# Patient Record
Sex: Female | Born: 1945 | Race: White | Hispanic: No | Smoking: Never smoker
Health system: Southern US, Community
[De-identification: ages and names within clinical notes are randomized; demographics above are authoritative.]

## PROBLEM LIST (undated history)

## (undated) DIAGNOSIS — G250 Essential tremor: Secondary | ICD-10-CM

## (undated) DIAGNOSIS — G20A1 Parkinson's disease without dyskinesia, without mention of fluctuations: Secondary | ICD-10-CM

## (undated) DIAGNOSIS — T148XXA Other injury of unspecified body region, initial encounter: Secondary | ICD-10-CM

## (undated) DIAGNOSIS — R931 Abnormal findings on diagnostic imaging of heart and coronary circulation: Secondary | ICD-10-CM

## (undated) DIAGNOSIS — M1990 Unspecified osteoarthritis, unspecified site: Secondary | ICD-10-CM

## (undated) DIAGNOSIS — E559 Vitamin D deficiency, unspecified: Secondary | ICD-10-CM

## (undated) DIAGNOSIS — R5383 Other fatigue: Secondary | ICD-10-CM

## (undated) DIAGNOSIS — C4492 Squamous cell carcinoma of skin, unspecified: Secondary | ICD-10-CM

## (undated) DIAGNOSIS — I82409 Acute embolism and thrombosis of unspecified deep veins of unspecified lower extremity: Secondary | ICD-10-CM

## (undated) DIAGNOSIS — I700 Atherosclerosis of aorta: Secondary | ICD-10-CM

## (undated) DIAGNOSIS — Z8249 Family history of ischemic heart disease and other diseases of the circulatory system: Secondary | ICD-10-CM

## (undated) DIAGNOSIS — G20 Parkinson's disease: Secondary | ICD-10-CM

## (undated) DIAGNOSIS — M79602 Pain in left arm: Secondary | ICD-10-CM

## (undated) DIAGNOSIS — M8580 Other specified disorders of bone density and structure, unspecified site: Secondary | ICD-10-CM

## (undated) HISTORY — DX: Other injury of unspecified body region, initial encounter: T14.8XXA

## (undated) HISTORY — DX: Vitamin D deficiency, unspecified: E55.9

## (undated) HISTORY — PX: MOUTH SURGERY: SHX715

## (undated) HISTORY — PX: KNEE ARTHROSCOPY WITH MENISCAL REPAIR: SHX5653

## (undated) HISTORY — DX: Abnormal findings on diagnostic imaging of heart and coronary circulation: R93.1

## (undated) HISTORY — DX: Other fatigue: R53.83

## (undated) HISTORY — PX: COLONOSCOPY: SHX174

## (undated) HISTORY — DX: Other specified disorders of bone density and structure, unspecified site: M85.80

## (undated) HISTORY — DX: Unspecified osteoarthritis, unspecified site: M19.90

## (undated) HISTORY — DX: Atherosclerosis of aorta: I70.0

## (undated) HISTORY — DX: Pain in left arm: M79.602

## (undated) HISTORY — DX: Family history of ischemic heart disease and other diseases of the circulatory system: Z82.49

## (undated) HISTORY — DX: Acute embolism and thrombosis of unspecified deep veins of unspecified lower extremity: I82.409

---

## 1998-06-02 ENCOUNTER — Other Ambulatory Visit: Admission: RE | Admit: 1998-06-02 | Discharge: 1998-06-02 | Payer: Self-pay | Admitting: Obstetrics & Gynecology

## 1999-07-18 ENCOUNTER — Other Ambulatory Visit: Admission: RE | Admit: 1999-07-18 | Discharge: 1999-07-18 | Payer: Self-pay | Admitting: Obstetrics and Gynecology

## 2000-10-24 ENCOUNTER — Other Ambulatory Visit: Admission: RE | Admit: 2000-10-24 | Discharge: 2000-10-24 | Payer: Self-pay | Admitting: Obstetrics & Gynecology

## 2001-12-09 ENCOUNTER — Other Ambulatory Visit: Admission: RE | Admit: 2001-12-09 | Discharge: 2001-12-09 | Payer: Self-pay | Admitting: Obstetrics & Gynecology

## 2003-01-14 ENCOUNTER — Other Ambulatory Visit: Admission: RE | Admit: 2003-01-14 | Discharge: 2003-01-14 | Payer: Self-pay | Admitting: Obstetrics & Gynecology

## 2004-02-25 ENCOUNTER — Other Ambulatory Visit: Admission: RE | Admit: 2004-02-25 | Discharge: 2004-02-25 | Payer: Self-pay | Admitting: Obstetrics & Gynecology

## 2005-03-20 ENCOUNTER — Other Ambulatory Visit: Admission: RE | Admit: 2005-03-20 | Discharge: 2005-03-20 | Payer: Self-pay | Admitting: Obstetrics & Gynecology

## 2013-07-11 ENCOUNTER — Other Ambulatory Visit: Payer: Self-pay | Admitting: Family Medicine

## 2013-07-11 ENCOUNTER — Ambulatory Visit
Admission: RE | Admit: 2013-07-11 | Discharge: 2013-07-11 | Disposition: A | Discharge status: Home / Self Care | Payer: Medicare Other | Source: Ambulatory Visit | Attending: Family Medicine | Admitting: Family Medicine

## 2013-07-11 DIAGNOSIS — R911 Solitary pulmonary nodule: Secondary | ICD-10-CM

## 2014-07-07 ENCOUNTER — Other Ambulatory Visit (HOSPITAL_COMMUNITY): Payer: Self-pay | Admitting: Family Medicine

## 2014-07-07 ENCOUNTER — Ambulatory Visit (HOSPITAL_COMMUNITY)
Admission: RE | Admit: 2014-07-07 | Discharge: 2014-07-07 | Disposition: A | Discharge status: Home / Self Care | Payer: Medicare Other | Source: Ambulatory Visit | Attending: Family Medicine | Admitting: Family Medicine

## 2014-07-07 DIAGNOSIS — M7120 Synovial cyst of popliteal space [Baker], unspecified knee: Secondary | ICD-10-CM | POA: Diagnosis not present

## 2014-07-07 DIAGNOSIS — M25569 Pain in unspecified knee: Secondary | ICD-10-CM | POA: Insufficient documentation

## 2014-07-07 DIAGNOSIS — M7989 Other specified soft tissue disorders: Secondary | ICD-10-CM

## 2014-07-07 DIAGNOSIS — M25562 Pain in left knee: Secondary | ICD-10-CM

## 2014-07-07 NOTE — Progress Notes (Addendum)
*  Preliminary Results* Left lower extremity venous duplex completed. Left lower extremity is positive for deep vein thrombosis involving the left posterior tibial and peroneal veins. There is no evidence of left Baker's cyst.  Preliminary results discussed with Horris Latino of Dr.Swayne's office.   07/07/2014 4:44 PM  Maudry Mayhew, RVT, RDCS, RDMS

## 2015-12-21 ENCOUNTER — Ambulatory Visit
Admission: RE | Admit: 2015-12-21 | Discharge: 2015-12-21 | Disposition: A | Discharge status: Home / Self Care | Payer: Medicare Other | Source: Ambulatory Visit | Attending: Family Medicine | Admitting: Family Medicine

## 2015-12-21 ENCOUNTER — Ambulatory Visit (HOSPITAL_COMMUNITY)
Admission: RE | Admit: 2015-12-21 | Discharge: 2015-12-21 | Disposition: A | Discharge status: Home / Self Care | Payer: Medicare Other | Source: Ambulatory Visit | Attending: Family Medicine | Admitting: Family Medicine

## 2015-12-21 ENCOUNTER — Other Ambulatory Visit: Payer: Self-pay | Admitting: Family Medicine

## 2015-12-21 ENCOUNTER — Other Ambulatory Visit (HOSPITAL_COMMUNITY): Payer: Self-pay | Admitting: Family Medicine

## 2015-12-21 DIAGNOSIS — M79661 Pain in right lower leg: Secondary | ICD-10-CM

## 2015-12-21 DIAGNOSIS — M7989 Other specified soft tissue disorders: Secondary | ICD-10-CM | POA: Diagnosis not present

## 2015-12-21 DIAGNOSIS — M79604 Pain in right leg: Secondary | ICD-10-CM

## 2015-12-21 DIAGNOSIS — R791 Abnormal coagulation profile: Secondary | ICD-10-CM | POA: Insufficient documentation

## 2015-12-21 NOTE — Progress Notes (Signed)
Preliminary results by tech - Right Lower Ext. Venous Duplex Completed. No evidence of deep or superficial vein thrombosis in the right leg.  Guiselle Mian, BS, RDMS, RVT  

## 2016-01-13 ENCOUNTER — Other Ambulatory Visit (HOSPITAL_COMMUNITY)
Admission: RE | Admit: 2016-01-13 | Discharge: 2016-01-13 | Disposition: A | Discharge status: Home / Self Care | Payer: Medicare Other | Source: Ambulatory Visit | Attending: Family Medicine | Admitting: Family Medicine

## 2016-01-13 ENCOUNTER — Other Ambulatory Visit: Payer: Self-pay | Admitting: Family Medicine

## 2016-01-13 DIAGNOSIS — Z124 Encounter for screening for malignant neoplasm of cervix: Secondary | ICD-10-CM | POA: Insufficient documentation

## 2016-01-17 LAB — CYTOLOGY - PAP

## 2019-10-13 ENCOUNTER — Other Ambulatory Visit: Payer: Self-pay

## 2019-10-13 DIAGNOSIS — Z20822 Contact with and (suspected) exposure to covid-19: Secondary | ICD-10-CM

## 2019-10-16 LAB — NOVEL CORONAVIRUS, NAA: SARS-CoV-2, NAA: NOT DETECTED

## 2019-12-16 ENCOUNTER — Ambulatory Visit (INDEPENDENT_AMBULATORY_CARE_PROVIDER_SITE_OTHER): Payer: Medicare PPO | Admitting: Neurology

## 2019-12-16 ENCOUNTER — Other Ambulatory Visit: Payer: Self-pay

## 2019-12-16 ENCOUNTER — Encounter: Payer: Medicare PPO | Admitting: Neurology

## 2019-12-16 DIAGNOSIS — M48061 Spinal stenosis, lumbar region without neurogenic claudication: Secondary | ICD-10-CM | POA: Diagnosis not present

## 2019-12-16 DIAGNOSIS — M79604 Pain in right leg: Secondary | ICD-10-CM

## 2019-12-16 DIAGNOSIS — Z0289 Encounter for other administrative examinations: Secondary | ICD-10-CM

## 2019-12-16 NOTE — Progress Notes (Signed)
Full Name: Katie Daniel Gender: Female MRN #: [id number] Date of Birth: 06-May-1946    Visit Date: 12/16/2019 08:11 Age: 74 Years Examining Physician: Katie Colt, MD Referring Physician: Melrose Nakayama, MD; Dr. Janice Daniel [facility])     History: [name] is a 74 year old woman with pain behind the right knee and in the lateral aspect of the proximal right calf.  Pain started in mid 2020 while she was doing physical therapy after surgery for a meniscus tear.  On exam, she had slightly altered sensation in the lateral aspect of the upper right lower leg but not further down towards the ankle or foot.  She also had tenderness over the peroneal nerve at the fibular head.  Strength was normal.  Nerve conduction studies: Bilateral peroneal and tibial motor responses had normal distal latencies, amplitudes and conduction velocities.  Bilateral sural and superficial peroneal sensory responses had normal peak latencies and amplitudes.  Tibial F-wave responses were normal.  Electromyography: Needle EMG of selected muscles of the right leg was performed.  There was no abnormal spontaneous activity in any of the muscles tested.  A few of the muscles tested had some polyphasic motor units but there was normal recruitment in all of the muscles tested.   Impression: This NCV/EMG study shows the following: 1.   There is no evidence of a mononeuropathy or polyneuropathy involving the peroneal or tibial nerves..    2.    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     Katie Daniel A. Felecia Shelling, MD, PhD, FAAN Certified in Neurology, Clinical Neurophysiology, Sleep Medicine, Pain Medicine and Neuroimaging Director, [place] [facility] [address] [place], [place] [address] [phone]   [facility]    Nerve / Sites Muscle Latency Ref. Amplitude Ref. Rel Amp Segments Distance Velocity Ref. Area   ms ms mV mV %  cm m/s m/s mVms  L Peroneal - EDB     Ankle EDB 4.5 ?6.5 7.1 ?2.0 100 Ankle - EDB 9   16.1     Fib head EDB 9.9  5.7  80.9 Fib head - Ankle 28 52 ?44 13.7     Pop fossa EDB 11.8  5.7  99.1 Pop fossa - Fib head 10 54 ?44 14.1         Pop fossa - Ankle      R Peroneal - EDB     Ankle EDB 4.5 ?6.5 4.1 ?2.0 100 Ankle - EDB 9   13.4     Fib head EDB 10.1  3.9  94.8 Fib head - Ankle 28 50 ?44 13.2     Pop fossa EDB 12.1  3.7  95.3 Pop fossa - Fib head 10 52 ?44 12.0         Pop fossa - Ankle      L Tibial - AH     Ankle AH 4.2 ?5.8 12.8 ?4.0 100 Ankle - AH 9   29.9     Pop fossa AH 12.7  8.0  62.3 Pop fossa - Ankle 40 47 ?41 24.8  R Tibial - AH     Ankle AH 5.0 ?5.8 9.6 ?4.0 100 Ankle - AH 9   21.0     Pop fossa AH 14.0  4.8  50.5 Pop fossa - Ankle 40 44 ?41 16.4             SNC    Nerve /  Sites Rec. Site Peak Lat Ref.  Amp Ref. Segments Distance    ms ms V V  cm  L Sural - Ankle (Calf)     Calf Ankle 3.4 ?4.4 16 ?6 Calf - Ankle 14  R Sural - Ankle (Calf)     Calf Ankle 3.8 ?4.4 6 ?6 Calf - Ankle 14  L Superficial peroneal - Ankle     Lat leg Ankle 3.9 ?4.4 6 ?6 Lat leg - Ankle 14  R Superficial peroneal - Ankle     Lat leg Ankle 3.9 ?4.4 9 ?6 Lat leg - Ankle 14             F  Wave    Nerve F Lat Ref.   ms ms  L Tibial - AH 53.0 ?56.0  R Tibial - AH 54.4 ?56.0         EMG Summary Table    Spontaneous MUAP Recruitment  Muscle IA Fib PSW Fasc Other Amp Dur. Poly Pattern  R. Vastus medialis Normal None None None _______ Normal Normal 1+ Normal  R. Tibialis anterior Normal None None None _______ Normal Normal Normal Normal  R. Peroneus longus Normal None None None _______ Normal Normal 1+ Normal  R. Gastrocnemius (Medial head) Normal None None None _______ Normal Normal 1+ Reduced  R. Abductor hallucis Normal None None None _______ Normal Normal 1+ Reduced  R. Gluteus medius Normal None None None _______ Normal Normal Normal Normal  R. Iliopsoas Normal None None  None _______ Normal Normal Normal Normal

## 2019-12-26 ENCOUNTER — Ambulatory Visit: Payer: Medicare PPO | Attending: Internal Medicine

## 2019-12-26 DIAGNOSIS — Z23 Encounter for immunization: Secondary | ICD-10-CM | POA: Insufficient documentation

## 2019-12-26 NOTE — Progress Notes (Signed)
   Covid-19 Vaccination Clinic  Name:  Katie Daniel    MRN: [id number] DOB: [birth year]  12/26/2019  Ms. Katie Daniel was observed post Covid-19 immunization for 15 minutes without incidence. She was provided with Vaccine Information Sheet and instruction to access the V-Safe system.   Ms. Katie Daniel was instructed to call 911 with any severe reactions post vaccine: Marland Kitchen Difficulty breathing  . Swelling of your face and throat  . A fast heartbeat  . A bad rash all over your body  . Dizziness and weakness    Immunizations Administered    Name Date Dose VIS Date Route   Pfizer COVID-19 Vaccine 12/26/2019  8:38 AM 0.3 mL 11/14/2019 Intramuscular   Manufacturer: [place]   Lot: [id number]   [place]: [id number]

## 2020-01-15 ENCOUNTER — Ambulatory Visit: Payer: Medicare PPO | Attending: Internal Medicine

## 2020-01-15 DIAGNOSIS — Z23 Encounter for immunization: Secondary | ICD-10-CM | POA: Insufficient documentation

## 2020-01-15 NOTE — Progress Notes (Signed)
   Covid-19 Vaccination Clinic  Name:  Katie Daniel    MRN: [id number] DOB: 12/18/1945  01/15/2020  Ms. Marks was observed post Covid-19 immunization for 15 minutes without incidence. She was provided with Vaccine Information Sheet and instruction to access the V-Safe system.   [name] was instructed to call 911 with any severe reactions post vaccine: Marland Kitchen Difficulty breathing  . Swelling of your face and throat  . A fast heartbeat  . A bad rash all over your body  . Dizziness and weakness    Immunizations Administered    Name Date Dose VIS Date Route   Pfizer COVID-19 Vaccine 01/15/2020  1:06 PM 0.3 mL 11/14/2019 Intramuscular   Manufacturer: [place]   Lot: [id number]   [place]: [id number]

## 2020-02-25 NOTE — H&P (Signed)
TOTAL KNEE ADMISSION H&P  Patient is being admitted for right total knee arthroplasty.  Subjective:  Chief Complaint:   Right knee primary OA / pain  HPI: Katie Daniel, 74 y.o. female, has a history of pain and functional disability in the right knee due to arthritis and has failed non-surgical conservative treatments for greater than 12 weeks to include NSAID's and/or analgesics, corticosteriod injections, viscosupplementation injections, supervised PT with diminished ADL's post treatment, use of assistive devices and activity modification.  Onset of symptoms was abrupt, starting 1 years ago with rapidlly worsening course since that time. The patient noted prior procedures on the knee to include  arthroscopy on the right knee in July 2020.  Patient currently rates pain in the right knee(s) at 9 out of 10 with activity. Patient has worsening of pain with activity and weight bearing, pain that interferes with activities of daily living, pain with passive range of motion, crepitus and joint swelling.  Patient has evidence of periarticular osteophytes and joint space narrowing by imaging studies. There is no active infection.  Risks, benefits and expectations were discussed with the patient.  Risks including but not limited to the risk of anesthesia, blood clots, nerve damage, blood vessel damage, failure of the prosthesis, infection and up to and including death.  Patient understand the risks, benefits and expectations and wishes to proceed with surgery.   PCP: Harlan Stains, MD  D/C Plans:       Home   Post-op Meds:       No Rx given  Tranexamic Acid:      To be given - IV   Decadron:      Is to be given  FYI:     ASA  Norco  DME:   Rx sent for - RW & 3-n-1  PT:   [place]: [place]    Patient Active Problem List   Diagnosis Date Noted  . Right leg pain 12/16/2019  . Spinal stenosis of lumbar region 12/16/2019   Past Medical History:  Diagnosis Date  .  Arthritis   . Essential tremor   . Parkinson disease ([place])   . Squamous cell skin cancer    Scalp and side of nose    Past Surgical History:  Procedure Laterality Date  . COLONOSCOPY    . KNEE ARTHROSCOPY WITH MENISCAL REPAIR Right   . MOUTH SURGERY      No current facility-administered medications for this encounter.   Current Outpatient Medications  Medication Sig Dispense Refill Last Dose  . acetaminophen (TYLENOL) 650 MG CR tablet Take 650 mg by mouth every 8 (eight) hours as needed for pain.     . calcium carbonate (OS-CAL) 1250 (500 Ca) MG chewable tablet Chew 2 tablets by mouth daily.     . carbidopa-levodopa (SINEMET IR) 25-100 MG tablet Take 1 tablet by mouth 4 (four) times daily.     . cholecalciferol (VITAMIN D3) 25 MCG (1000 UNIT) tablet Take 4,000 Units by mouth daily.     . diclofenac Sodium (VOLTAREN) 1 % GEL Apply 2-4 g topically 2 (two) times daily as needed (pain.).     Marland Kitchen fluticasone (FLONASE) 50 MCG/ACT nasal spray Place 1 spray into both nostrils daily as needed for allergies or rhinitis.     Marland Kitchen primidone (MYSOLINE) 50 MG tablet Take 50 mg by mouth 2 (two) times daily.     . Probiotic Product (ALIGN) 4 MG CAPS Take 4 mg by mouth 3 (  three) times a week.     . Pseudoephedrine-Ibuprofen (ADVIL COLD & SINUS LIQUI-GELS PO) Take 1 tablet by mouth daily as needed (allergies).     Marland Kitchen rOPINIRole (REQUIP XL) 8 MG 24 hr tablet Take 8 mg by mouth daily.      Allergies  Allergen Reactions  . Penicillins Hives    Did it involve swelling of the face/tongue/throat, SOB, or low BP? No Did it involve sudden or severe rash/hives, skin peeling, or any reaction on the inside of your mouth or nose? Yes Did you need to seek medical attention at a hospital or doctor's office? Yes When did it last happen?5 years If all above answers are "NO", may proceed with cephalosporin use.      Social History   Tobacco Use  . Smoking status: Never Smoker  . Smokeless tobacco: Never Used   Substance Use Topics  . Alcohol use: Yes    Comment: glass of wine occ     Review of Systems  Constitutional: Negative.   HENT: Negative.   Eyes: Negative.   Respiratory: Negative.   Cardiovascular: Negative.   Gastrointestinal: Negative.   Genitourinary: Negative.   Musculoskeletal: Positive for joint pain.  Skin: Negative.   Neurological: Positive for tremors.  Endo/Heme/Allergies: Negative.   Psychiatric/Behavioral: Negative.      Objective:  Physical Exam  Constitutional: She is oriented to person, place, and time. She appears well-developed.  HENT:  Head: Normocephalic.  Eyes: Pupils are equal, round, and reactive to light.  Neck: No JVD present. No tracheal deviation present. No thyromegaly present.  Cardiovascular: Normal rate, regular rhythm and intact distal pulses.  Respiratory: Effort normal and breath sounds normal. No respiratory distress. She has no wheezes.  GI: Soft. There is no abdominal tenderness. There is no guarding.  Musculoskeletal:     Cervical back: Neck supple.     Right knee: Swelling and bony tenderness present. No deformity, erythema, ecchymosis or lacerations. Decreased range of motion. Tenderness present.  Lymphadenopathy:    She has no cervical adenopathy.  Neurological: She is alert and oriented to person, place, and time.  Skin: Skin is warm and dry.  Psychiatric: She has a normal mood and affect.      Imaging Review Plain radiographs demonstrate severe degenerative joint disease of the right knee.  The bone quality appears to be good for age and reported activity level.      Assessment/Plan:  End stage arthritis, right knee   The patient history, physical examination, clinical judgment of the provider and imaging studies are consistent with end stage degenerative joint disease of the right knee and total knee arthroplasty is deemed medically necessary. The treatment options including medical management, injection therapy  arthroscopy and arthroplasty were discussed at length. The risks and benefits of total knee arthroplasty were presented and reviewed. The risks due to aseptic loosening, infection, stiffness, patella tracking problems, thromboembolic complications and other imponderables were discussed. The patient acknowledged the explanation, agreed to proceed with the plan and consent was signed. Patient is being admitted for inpatient treatment for surgery, pain control, PT, OT, prophylactic antibiotics, VTE prophylaxis, progressive ambulation and ADL's and discharge planning. The patient is planning to be discharged home.     Patient's anticipated LOS is less than 2 midnights, meeting these requirements: - Lives within 1 hour of care - Has a competent adult at home to recover with post-op recover - NO history of  - Chronic pain requiring opiods  - Diabetes  -  Coronary Artery Disease  - Heart failure  - Heart attack  - Stroke  - DVT/VTE  - Cardiac arrhythmia  - Respiratory Failure/COPD  - Renal failure  - Anemia  - Advanced Liver disease    [place]. Nalaysia Manganiello   PA-C  03/15/2020, 3:23 PM

## 2020-03-08 NOTE — Patient Instructions (Addendum)
DUE TO COVID-19 ONLY TWO VISITORS ARE ALLOWED TO COME WITH YOU AND STAY IN THE WAITING ROOM ONLY DURING PRE OP AND PROCEDURE. THE TWO VISITORS MAY VISIT WITH YOU IN YOUR PRIVATE ROOM DURING VISITING HOURS ONLY!!   COVID SWAB TESTING MUST BE COMPLETED ON:  Friday, March 12, 2020 at 3:25 PM    [address] [place] -Former [facility], [facility], [facility] enter pre surgical testing line (Must self quarantine after testing. Follow instructions on handout.)             Your procedure is scheduled on: Tuesday, March 16, 2020   Report to [facility] Main  Entrance    Report to admitting at 7:30 AM   Call this number if you have problems the morning of surgery [phone]   Do not eat food :After Midnight.   May have liquids until 7:00 AM day of surgery   CLEAR LIQUID DIET  Foods Allowed                                                                     Foods Excluded  Water, Black Coffee and tea, regular and decaf                             liquids that you cannot  Plain Jell-O in any flavor  (No red)                                           see through such as: Fruit ices (not with fruit pulp)                                     milk, soups, orange juice  Iced Popsicles (No red)                                    All solid food Carbonated beverages, regular and diet                                    Apple juices Sports drinks like Gatorade (No red) Lightly seasoned clear broth or consume(fat free) Sugar, honey syrup  Sample Menu Breakfast                                Lunch                                     Supper Cranberry juice                    Beef broth                            Chicken  broth Jell-O                                     Grape juice                           Apple juice Coffee or tea                        Jell-O                                      Popsicle                                                Coffee or tea                        Coffee  or tea   Complete one Ensure drink the morning of surgery at 7:00 AM the day of surgery.   Oral Hygiene is also important to reduce your risk of infection.                                    Remember - BRUSH YOUR TEETH THE MORNING OF SURGERY WITH YOUR REGULAR TOOTHPASTE   Do NOT smoke after Midnight   Take these medicines the morning of surgery with A SIP OF WATER: Carbidopa-levodopa, Primidone, Ropinole   Flonase if needed                               You may not have any metal on your body including hair pins, jewelry, and body piercings             Do not wear make-up, lotions, powders, perfumes/cologne, or deodorant             Do not wear nail polish.  Do not shave  48 hours prior to surgery.              Do not bring valuables to the hospital. [place].   Contacts, dentures or bridgework may not be worn into surgery.   Bring small overnight bag day of surgery.    Patients discharged the day of surgery will not be allowed to drive home.   Special Instructions: Bring a copy of your healthcare power of attorney and living will documents         the day of surgery if you haven't scanned them in before.              Please read over the following fact sheets you were given:  [facility] - Preparing for Surgery Before surgery, you can play an important role.  Because skin is not sterile, your skin needs to be as free of germs as possible.  You can reduce the number of germs on your skin by washing with CHG (chlorahexidine gluconate) soap before surgery.  CHG is an antiseptic cleaner which kills germs and bonds with the skin to continue killing germs even after washing. Please DO NOT use if you have an allergy to CHG or antibacterial soaps.  If your skin becomes reddened/irritated stop using the CHG and inform your nurse when you arrive at Short Stay. Do not shave (including legs and underarms) for at least 48 hours prior to the first  CHG shower.  You may shave your face/neck.  Please follow these instructions carefully:  1.  Shower with CHG Soap the night before surgery and the  morning of surgery.  2.  If you choose to wash your hair, wash your hair first as usual with your normal  shampoo.  3.  After you shampoo, rinse your hair and body thoroughly to remove the shampoo.                             4.  Use CHG as you would any other liquid soap.  You can apply chg directly to the skin and wash.  Gently with a scrungie or clean washcloth.  5.  Apply the CHG Soap to your body ONLY FROM THE NECK DOWN.   Do   not use on face/ open                           Wound or open sores. Avoid contact with eyes, ears mouth and   genitals (private parts).                       Wash face,  Genitals (private parts) with your normal soap.             6.  Wash thoroughly, paying special attention to the area where your    surgery  will be performed.  7.  Thoroughly rinse your body with warm water from the neck down.  8.  DO NOT shower/wash with your normal soap after using and rinsing off the CHG Soap.                9.  Pat yourself dry with a clean towel.            10.  Wear clean pajamas.            11.  Place clean sheets on your bed the night of your first shower and do not  sleep with pets. Day of Surgery : Do not apply any lotions/deodorants the morning of surgery.  Please wear clean clothes to the hospital/surgery center.  FAILURE TO FOLLOW THESE INSTRUCTIONS MAY RESULT IN THE CANCELLATION OF YOUR SURGERY  PATIENT SIGNATURE_________________________________  NURSE SIGNATURE__________________________________  ________________________________________________________________________   Adam Phenix  An incentive spirometer is a tool that can help keep your lungs clear and active. This tool measures how well you are filling your lungs with each breath. Taking long deep breaths may help reverse or decrease the chance of  developing breathing (pulmonary) problems (especially infection) following:  A long period of time when you are unable to move or be active. BEFORE THE PROCEDURE   If the spirometer includes an indicator to show your best effort, your nurse or respiratory therapist will set it to a desired goal.  If possible, sit up straight or lean slightly forward. Try not to slouch.  Hold the incentive spirometer in an upright position. INSTRUCTIONS FOR USE  1.  Sit on the edge of your bed if possible, or sit up as far as you can in bed or on a chair. 2. Hold the incentive spirometer in an upright position. 3. Breathe out normally. 4. Place the mouthpiece in your mouth and seal your lips tightly around it. 5. Breathe in slowly and as deeply as possible, raising the piston or the ball toward the top of the column. 6. Hold your breath for 3-5 seconds or for as long as possible. Allow the piston or ball to fall to the bottom of the column. 7. Remove the mouthpiece from your mouth and breathe out normally. 8. Rest for a few seconds and repeat Steps 1 through 7 at least 10 times every 1-2 hours when you are awake. Take your time and take a few normal breaths between deep breaths. 9. The spirometer may include an indicator to show your best effort. Use the indicator as a goal to work toward during each repetition. 10. After each set of 10 deep breaths, practice coughing to be sure your lungs are clear. If you have an incision (the cut made at the time of surgery), support your incision when coughing by placing a pillow or rolled up towels firmly against it. Once you are able to get out of bed, walk around indoors and cough well. You may stop using the incentive spirometer when instructed by your caregiver.  RISKS AND COMPLICATIONS  Take your time so you do not get dizzy or light-headed.  If you are in pain, you may need to take or ask for pain medication before doing incentive spirometry. It is harder to take a  deep breath if you are having pain. AFTER USE  Rest and breathe slowly and easily.  It can be helpful to keep track of a log of your progress. Your caregiver can provide you with a simple table to help with this. If you are using the spirometer at home, follow these instructions: [place] IF:   You are having difficultly using the spirometer.  You have trouble using the spirometer as often as instructed.  Your pain medication is not giving enough relief while using the spirometer.  You develop fever of 100.5 F (38.1 C) or higher. SEEK IMMEDIATE MEDICAL CARE IF:   You cough up bloody sputum that had not been present before.  You develop fever of 102 F (38.9 C) or greater.  You develop worsening pain at or near the incision site. MAKE SURE YOU:   Understand these instructions.  Will watch your condition.  Will get help right away if you are not doing well or get worse. Document Released: 04/02/2007 Document Revised: 02/12/2012 Document Reviewed: 06/03/2007 Exit[employer] Patient Information 2014 [employer], [place].   ________________________________________________________________________  WHAT IS A BLOOD TRANSFUSION? Blood Transfusion Information  A transfusion is the replacement of blood or some of its parts. Blood is made up of multiple cells which provide different functions.  Red blood cells carry oxygen and are used for blood loss replacement.  White blood cells fight against infection.  Platelets control bleeding.  Plasma helps clot blood.  Other blood products are available for specialized needs, such as hemophilia or other clotting disorders. BEFORE THE TRANSFUSION  Who gives blood for transfusions?   Healthy volunteers who are fully evaluated to make sure their blood is safe. This is blood bank blood. Transfusion therapy is the safest it has ever been in the practice of medicine. Before blood is taken from a donor, a  complete history is taken to make  sure that person has no history of diseases nor engages in risky social behavior (examples are intravenous drug use or sexual activity with multiple partners). The donor's travel history is screened to minimize risk of transmitting infections, such as malaria. The donated blood is tested for signs of infectious diseases, such as HIV and hepatitis. The blood is then tested to be sure it is compatible with you in order to minimize the chance of a transfusion reaction. If you or a relative donates blood, this is often done in anticipation of surgery and is not appropriate for emergency situations. It takes many days to process the donated blood. RISKS AND COMPLICATIONS Although transfusion therapy is very safe and saves many lives, the main dangers of transfusion include:   Getting an infectious disease.  Developing a transfusion reaction. This is an allergic reaction to something in the blood you were given. Every precaution is taken to prevent this. The decision to have a blood transfusion has been considered carefully by your caregiver before blood is given. Blood is not given unless the benefits outweigh the risks. AFTER THE TRANSFUSION  Right after receiving a blood transfusion, you will usually feel much better and more energetic. This is especially true if your red blood cells have gotten low (anemic). The transfusion raises the level of the red blood cells which carry oxygen, and this usually causes an energy increase.  The nurse administering the transfusion will monitor you carefully for complications. HOME CARE INSTRUCTIONS  No special instructions are needed after a transfusion. You may find your energy is better. Speak with your caregiver about any limitations on activity for underlying diseases you may have. SEEK MEDICAL CARE IF:   Your condition is not improving after your transfusion.  You develop redness or irritation at the intravenous (IV) site. SEEK IMMEDIATE MEDICAL CARE IF:   Any of the following symptoms occur over the next 12 hours:  Shaking chills.  You have a temperature by mouth above 102 F (38.9 C), not controlled by medicine.  Chest, back, or muscle pain.  People around you feel you are not acting correctly or are confused.  Shortness of breath or difficulty breathing.  Dizziness and fainting.  You get a rash or develop hives.  You have a decrease in urine output.  Your urine turns a dark color or changes to pink, red, or brown. Any of the following symptoms occur over the next 10 days:  You have a temperature by mouth above 102 F (38.9 C), not controlled by medicine.  Shortness of breath.  Weakness after normal activity.  The white part of the eye turns yellow (jaundice).  You have a decrease in the amount of urine or are urinating less often.  Your urine turns a dark color or changes to pink, red, or brown. Document Released: 11/17/2000 Document Revised: 02/12/2012 Document Reviewed: 07/06/2008 [facility] Patient Information 2014 [place], [place].  _______________________________________________________________________

## 2020-03-09 ENCOUNTER — Other Ambulatory Visit: Payer: Self-pay

## 2020-03-09 ENCOUNTER — Encounter (HOSPITAL_COMMUNITY): Payer: Self-pay

## 2020-03-09 ENCOUNTER — Encounter (HOSPITAL_COMMUNITY)
Admission: RE | Admit: 2020-03-09 | Discharge: 2020-03-09 | Disposition: A | Discharge status: Home / Self Care | Payer: Medicare PPO | Source: Ambulatory Visit | Attending: Orthopedic Surgery | Admitting: Orthopedic Surgery

## 2020-03-09 DIAGNOSIS — Z79899 Other long term (current) drug therapy: Secondary | ICD-10-CM | POA: Diagnosis not present

## 2020-03-09 DIAGNOSIS — Z01812 Encounter for preprocedural laboratory examination: Secondary | ICD-10-CM | POA: Insufficient documentation

## 2020-03-09 DIAGNOSIS — G20 Parkinson's disease: Secondary | ICD-10-CM | POA: Diagnosis not present

## 2020-03-09 DIAGNOSIS — M1711 Unilateral primary osteoarthritis, right knee: Secondary | ICD-10-CM | POA: Diagnosis not present

## 2020-03-09 HISTORY — DX: Essential tremor: G25.0

## 2020-03-09 HISTORY — DX: Parkinson's disease without dyskinesia, without mention of fluctuations: G20.A1

## 2020-03-09 HISTORY — DX: Squamous cell carcinoma of skin, unspecified: C44.92

## 2020-03-09 HISTORY — DX: Parkinson's disease: G20

## 2020-03-09 HISTORY — DX: Unspecified osteoarthritis, unspecified site: M19.90

## 2020-03-09 LAB — BASIC METABOLIC PANEL
Anion gap: 10 (ref 5–15)
BUN: 25 mg/dL — ABNORMAL HIGH (ref 8–23)
CO2: 29 mmol/L (ref 22–32)
Calcium: 8.9 mg/dL (ref 8.9–10.3)
Chloride: 101 mmol/L (ref 98–111)
Creatinine, Ser: 0.69 mg/dL (ref 0.44–1.00)
GFR calc Af Amer: >60 mL/min (ref >60)
GFR calc non Af Amer: >60 mL/min (ref >60)
Glucose, Bld: 104 mg/dL — ABNORMAL HIGH (ref 70–99)
Potassium: 4.6 mmol/L (ref 3.5–5.1)
Sodium: 140 mmol/L (ref 135–145)

## 2020-03-09 LAB — CBC
HCT: 42.9 % (ref 36.0–46.0)
Hemoglobin: 13.5 g/dL (ref 12.0–15.0)
MCH: 29.8 pg (ref 26.0–34.0)
MCHC: 31.5 g/dL (ref 30.0–36.0)
MCV: 94.7 fL (ref 80.0–100.0)
Platelets: 266 10*3/uL (ref 150–400)
RBC: 4.53 MIL/uL (ref 3.87–5.11)
RDW: 13.8 % (ref 11.5–15.5)
WBC: 9.5 10*3/uL (ref 4.0–10.5)
nRBC: 0 % (ref 0.0–0.2)

## 2020-03-09 LAB — SURGICAL PCR SCREEN
MRSA, PCR: NEGATIVE
Staphylococcus aureus: NEGATIVE

## 2020-03-09 NOTE — Progress Notes (Signed)
PCP - Dr. Deland Pretty Cardiologist - over 10 years ago  Chest x-ray - N/A EKG - N/A Stress Test - greater than 2 years ECHO - N/A Cardiac Cath - N/A  Sleep Study - N/A CPAP - N/A  Fasting Blood Sugar - N/A Checks Blood Sugar _N/A____ times a day  Blood Thinner Instructions:  N/A Aspirin Instructions: N/A Last Dose: N/A  Anesthesia review: N/A  Patient denies shortness of breath, fever, cough and chest pain at PAT appointment   Patient verbalized understanding of instructions that were given to them at the PAT appointment. Patient was also instructed that they will need to review over the PAT instructions again at home before surgery.

## 2020-03-10 LAB — ABO/RH: ABO/RH(D): O POS

## 2020-03-12 ENCOUNTER — Other Ambulatory Visit (HOSPITAL_COMMUNITY)
Admission: RE | Admit: 2020-03-12 | Discharge: 2020-03-12 | Disposition: A | Discharge status: Home / Self Care | Payer: Medicare PPO | Source: Ambulatory Visit | Attending: Orthopedic Surgery | Admitting: Orthopedic Surgery

## 2020-03-12 DIAGNOSIS — Z01812 Encounter for preprocedural laboratory examination: Secondary | ICD-10-CM | POA: Insufficient documentation

## 2020-03-12 DIAGNOSIS — Z20822 Contact with and (suspected) exposure to covid-19: Secondary | ICD-10-CM | POA: Diagnosis not present

## 2020-03-13 LAB — SARS CORONAVIRUS 2 (TAT 6-24 HRS): SARS Coronavirus 2: NEGATIVE

## 2020-03-16 ENCOUNTER — Encounter (HOSPITAL_COMMUNITY): Payer: Self-pay | Admitting: Orthopedic Surgery

## 2020-03-16 ENCOUNTER — Observation Stay (HOSPITAL_COMMUNITY)
Admission: RE | Admit: 2020-03-16 | Discharge: 2020-03-17 | Disposition: A | Discharge status: Home / Self Care | Payer: Medicare PPO | Attending: Orthopedic Surgery | Admitting: Orthopedic Surgery

## 2020-03-16 ENCOUNTER — Encounter (HOSPITAL_COMMUNITY)
Admission: RE | Disposition: A | Discharge status: Home / Self Care | Payer: Self-pay | Source: Home / Self Care | Attending: Orthopedic Surgery

## 2020-03-16 ENCOUNTER — Ambulatory Visit (HOSPITAL_COMMUNITY): Payer: Medicare PPO | Admitting: Certified Registered Nurse Anesthetist

## 2020-03-16 ENCOUNTER — Other Ambulatory Visit: Payer: Self-pay

## 2020-03-16 DIAGNOSIS — G20 Parkinson's disease: Secondary | ICD-10-CM | POA: Insufficient documentation

## 2020-03-16 DIAGNOSIS — M1711 Unilateral primary osteoarthritis, right knee: Principal | ICD-10-CM | POA: Insufficient documentation

## 2020-03-16 DIAGNOSIS — Z880 Allergy status to penicillin: Secondary | ICD-10-CM | POA: Diagnosis not present

## 2020-03-16 DIAGNOSIS — G250 Essential tremor: Secondary | ICD-10-CM | POA: Diagnosis not present

## 2020-03-16 DIAGNOSIS — Z96651 Presence of right artificial knee joint: Secondary | ICD-10-CM

## 2020-03-16 DIAGNOSIS — Z79899 Other long term (current) drug therapy: Secondary | ICD-10-CM | POA: Diagnosis not present

## 2020-03-16 HISTORY — PX: TOTAL KNEE ARTHROPLASTY: SHX125

## 2020-03-16 LAB — TYPE AND SCREEN
ABO/RH(D): O POS
Antibody Screen: NEGATIVE

## 2020-03-16 SURGERY — ARTHROPLASTY, KNEE, TOTAL
Anesthesia: Spinal | Site: Knee | Laterality: Right

## 2020-03-16 MED ORDER — ONDANSETRON HCL 4 MG PO TABS: 4.0000 mg | ORAL_TABLET | Freq: Four times a day (QID) | ORAL | Status: DC | PRN

## 2020-03-16 MED ORDER — MIDAZOLAM HCL 2 MG/2ML IJ SOLN
1.0000 mg | INTRAMUSCULAR | Status: DC
  Administered 2020-03-16: 0.5 mg via INTRAVENOUS
  Filled 2020-03-16: qty 2

## 2020-03-16 MED ORDER — FERROUS SULFATE 325 (65 FE) MG PO TABS
325.0000 mg | ORAL_TABLET | Freq: Two times a day (BID) | ORAL | Status: DC
  Administered 2020-03-16 – 2020-03-17 (×2): 325 mg via ORAL
  Filled 2020-03-16 (×2): qty 1

## 2020-03-16 MED ORDER — DEXAMETHASONE SODIUM PHOSPHATE 10 MG/ML IJ SOLN
10.0000 mg | Freq: Once | INTRAMUSCULAR | Status: AC
  Administered 2020-03-17: 10 mg via INTRAVENOUS
  Filled 2020-03-16: qty 1

## 2020-03-16 MED ORDER — KETOROLAC TROMETHAMINE 30 MG/ML IJ SOLN
INTRAMUSCULAR | Status: DC | PRN
  Administered 2020-03-16: 30 mg

## 2020-03-16 MED ORDER — HYDROCODONE-ACETAMINOPHEN 7.5-325 MG PO TABS
1.0000 | ORAL_TABLET | ORAL | Status: DC | PRN
  Administered 2020-03-16: 1 via ORAL
  Filled 2020-03-16: qty 1

## 2020-03-16 MED ORDER — MAGNESIUM CITRATE PO SOLN: 1.0000 | Freq: Once | ORAL | Status: DC | PRN

## 2020-03-16 MED ORDER — TRANEXAMIC ACID-NACL 1000-0.7 MG/100ML-% IV SOLN
1000.0000 mg | Freq: Once | INTRAVENOUS | Status: AC
  Administered 2020-03-16: 1000 mg via INTRAVENOUS
  Filled 2020-03-16: qty 100

## 2020-03-16 MED ORDER — FLUTICASONE PROPIONATE 50 MCG/ACT NA SUSP
1.0000 | Freq: Every day | NASAL | Status: DC | PRN
  Filled 2020-03-16: qty 16

## 2020-03-16 MED ORDER — POVIDONE-IODINE 10 % EX SWAB
2.0000 "application " | Freq: Once | CUTANEOUS | Status: AC
  Administered 2020-03-16: 2 via TOPICAL

## 2020-03-16 MED ORDER — ASPIRIN 81 MG PO CHEW
81.0000 mg | CHEWABLE_TABLET | Freq: Two times a day (BID) | ORAL | Status: DC
  Administered 2020-03-16 – 2020-03-17 (×2): 81 mg via ORAL
  Filled 2020-03-16 (×2): qty 1

## 2020-03-16 MED ORDER — ONDANSETRON HCL 4 MG/2ML IJ SOLN: 4.0000 mg | Freq: Four times a day (QID) | INTRAMUSCULAR | Status: DC | PRN

## 2020-03-16 MED ORDER — METOCLOPRAMIDE HCL 5 MG/ML IJ SOLN: 5.0000 mg | Freq: Three times a day (TID) | INTRAMUSCULAR | Status: DC | PRN

## 2020-03-16 MED ORDER — STERILE WATER FOR IRRIGATION IR SOLN
Status: DC | PRN
  Administered 2020-03-16 (×2): 1000 mL

## 2020-03-16 MED ORDER — PHENOL 1.4 % MT LIQD: 1.0000 | OROMUCOSAL | Status: DC | PRN

## 2020-03-16 MED ORDER — ROPIVACAINE HCL 5 MG/ML IJ SOLN
INTRAMUSCULAR | Status: DC | PRN
  Administered 2020-03-16: 20 mL via PERINEURAL

## 2020-03-16 MED ORDER — CELECOXIB 200 MG PO CAPS
200.0000 mg | ORAL_CAPSULE | Freq: Two times a day (BID) | ORAL | Status: DC
  Administered 2020-03-16 – 2020-03-17 (×2): 200 mg via ORAL
  Filled 2020-03-16 (×2): qty 1

## 2020-03-16 MED ORDER — ACETAMINOPHEN 325 MG PO TABS
325.0000 mg | ORAL_TABLET | Freq: Four times a day (QID) | ORAL | Status: DC | PRN
  Administered 2020-03-17: 650 mg via ORAL
  Filled 2020-03-16: qty 2

## 2020-03-16 MED ORDER — PROPOFOL 500 MG/50ML IV EMUL
INTRAVENOUS | Status: DC | PRN
  Administered 2020-03-16: 50 ug/kg/min via INTRAVENOUS

## 2020-03-16 MED ORDER — METHOCARBAMOL 500 MG PO TABS
500.0000 mg | ORAL_TABLET | Freq: Four times a day (QID) | ORAL | Status: DC | PRN
  Administered 2020-03-16 – 2020-03-17 (×2): 500 mg via ORAL
  Filled 2020-03-16 (×2): qty 1

## 2020-03-16 MED ORDER — PRIMIDONE 50 MG PO TABS
50.0000 mg | ORAL_TABLET | Freq: Two times a day (BID) | ORAL | Status: DC
  Administered 2020-03-16: 50 mg via ORAL
  Filled 2020-03-16 (×4): qty 1

## 2020-03-16 MED ORDER — METHOCARBAMOL 500 MG IVPB - SIMPLE MED
500.0000 mg | Freq: Four times a day (QID) | INTRAVENOUS | Status: DC | PRN
  Filled 2020-03-16: qty 50

## 2020-03-16 MED ORDER — SODIUM CHLORIDE 0.9 % IR SOLN
Status: DC | PRN
  Administered 2020-03-16: 1000 mL

## 2020-03-16 MED ORDER — ALUM & MAG HYDROXIDE-SIMETH 200-200-20 MG/5ML PO SUSP: 15.0000 mL | ORAL | Status: DC | PRN

## 2020-03-16 MED ORDER — TRANEXAMIC ACID-NACL 1000-0.7 MG/100ML-% IV SOLN
1000.0000 mg | INTRAVENOUS | Status: AC
  Administered 2020-03-16: 1000 mg via INTRAVENOUS
  Filled 2020-03-16: qty 100

## 2020-03-16 MED ORDER — POLYETHYLENE GLYCOL 3350 17 G PO PACK
17.0000 g | PACK | Freq: Two times a day (BID) | ORAL | Status: DC
  Administered 2020-03-16 – 2020-03-17 (×2): 17 g via ORAL
  Filled 2020-03-16 (×2): qty 1

## 2020-03-16 MED ORDER — CLONIDINE HCL (ANALGESIA) 100 MCG/ML EP SOLN
EPIDURAL | Status: DC | PRN
  Administered 2020-03-16: 50 ug

## 2020-03-16 MED ORDER — DIPHENHYDRAMINE HCL 12.5 MG/5ML PO ELIX: 12.5000 mg | ORAL_SOLUTION | ORAL | Status: DC | PRN

## 2020-03-16 MED ORDER — PROPOFOL 1000 MG/100ML IV EMUL
INTRAVENOUS | Status: AC
  Filled 2020-03-16: qty 100

## 2020-03-16 MED ORDER — HYDROCODONE-ACETAMINOPHEN 5-325 MG PO TABS
1.0000 | ORAL_TABLET | ORAL | Status: DC | PRN
  Administered 2020-03-16: 2 via ORAL
  Administered 2020-03-16: 1 via ORAL
  Administered 2020-03-17: 2 via ORAL
  Filled 2020-03-16: qty 1
  Filled 2020-03-16 (×2): qty 2

## 2020-03-16 MED ORDER — PROPOFOL 10 MG/ML IV BOLUS
INTRAVENOUS | Status: DC | PRN
  Administered 2020-03-16 (×3): 10 mg via INTRAVENOUS
  Administered 2020-03-16: 20 mg via INTRAVENOUS

## 2020-03-16 MED ORDER — BUPIVACAINE HCL (PF) 0.25 % IJ SOLN
INTRAMUSCULAR | Status: AC
  Filled 2020-03-16: qty 30

## 2020-03-16 MED ORDER — LACTATED RINGERS IV SOLN: INTRAVENOUS | Status: DC

## 2020-03-16 MED ORDER — ONDANSETRON HCL 4 MG/2ML IJ SOLN
INTRAMUSCULAR | Status: DC | PRN
  Administered 2020-03-16: 4 mg via INTRAVENOUS

## 2020-03-16 MED ORDER — ONDANSETRON HCL 4 MG/2ML IJ SOLN: 4.0000 mg | Freq: Once | INTRAMUSCULAR | Status: DC | PRN

## 2020-03-16 MED ORDER — DOCUSATE SODIUM 100 MG PO CAPS
100.0000 mg | ORAL_CAPSULE | Freq: Two times a day (BID) | ORAL | Status: DC
  Administered 2020-03-16 – 2020-03-17 (×2): 100 mg via ORAL
  Filled 2020-03-16 (×2): qty 1

## 2020-03-16 MED ORDER — FENTANYL CITRATE (PF) 100 MCG/2ML IJ SOLN: 25.0000 ug | INTRAMUSCULAR | Status: DC | PRN

## 2020-03-16 MED ORDER — ROPINIROLE HCL ER 8 MG PO TB24
8.0000 mg | ORAL_TABLET | Freq: Every day | ORAL | Status: DC
  Administered 2020-03-17: 8 mg via ORAL
  Filled 2020-03-16: qty 1

## 2020-03-16 MED ORDER — SODIUM CHLORIDE (PF) 0.9 % IJ SOLN
INTRAMUSCULAR | Status: AC
  Filled 2020-03-16: qty 50

## 2020-03-16 MED ORDER — BUPIVACAINE HCL (PF) 0.25 % IJ SOLN
INTRAMUSCULAR | Status: DC | PRN
  Administered 2020-03-16: 30 mL

## 2020-03-16 MED ORDER — CEFAZOLIN SODIUM-DEXTROSE 2-4 GM/100ML-% IV SOLN
2.0000 g | Freq: Four times a day (QID) | INTRAVENOUS | Status: AC
  Administered 2020-03-16 (×2): 2 g via INTRAVENOUS
  Filled 2020-03-16 (×2): qty 100

## 2020-03-16 MED ORDER — BISACODYL 10 MG RE SUPP: 10.0000 mg | Freq: Every day | RECTAL | Status: DC | PRN

## 2020-03-16 MED ORDER — HYDROMORPHONE HCL 1 MG/ML IJ SOLN
0.5000 mg | INTRAMUSCULAR | Status: DC | PRN
  Administered 2020-03-16: 0.5 mg via INTRAVENOUS
  Filled 2020-03-16 (×2): qty 1

## 2020-03-16 MED ORDER — FENTANYL CITRATE (PF) 100 MCG/2ML IJ SOLN
50.0000 ug | INTRAMUSCULAR | Status: DC
  Administered 2020-03-16: 50 ug via INTRAVENOUS
  Filled 2020-03-16: qty 2

## 2020-03-16 MED ORDER — LIDOCAINE 2% (20 MG/ML) 5 ML SYRINGE
INTRAMUSCULAR | Status: DC | PRN
  Administered 2020-03-16: 20 mg via INTRAVENOUS

## 2020-03-16 MED ORDER — CEFAZOLIN SODIUM-DEXTROSE 2-4 GM/100ML-% IV SOLN
2.0000 g | INTRAVENOUS | Status: AC
  Administered 2020-03-16: 10:00:00 2 g via INTRAVENOUS
  Filled 2020-03-16: qty 100

## 2020-03-16 MED ORDER — EPHEDRINE SULFATE-NACL 50-0.9 MG/10ML-% IV SOSY
PREFILLED_SYRINGE | INTRAVENOUS | Status: DC | PRN
  Administered 2020-03-16 (×2): 10 mg via INTRAVENOUS
  Administered 2020-03-16: 5 mg via INTRAVENOUS

## 2020-03-16 MED ORDER — CARBIDOPA-LEVODOPA 25-100 MG PO TABS
1.0000 | ORAL_TABLET | Freq: Four times a day (QID) | ORAL | Status: DC
  Administered 2020-03-16 – 2020-03-17 (×5): 1 via ORAL
  Filled 2020-03-16 (×5): qty 1

## 2020-03-16 MED ORDER — SODIUM CHLORIDE (PF) 0.9 % IJ SOLN
INTRAMUSCULAR | Status: DC | PRN
  Administered 2020-03-16: 30 mL

## 2020-03-16 MED ORDER — PROPOFOL 10 MG/ML IV BOLUS
INTRAVENOUS | Status: AC
  Filled 2020-03-16: qty 20

## 2020-03-16 MED ORDER — MENTHOL 3 MG MT LOZG: 1.0000 | LOZENGE | OROMUCOSAL | Status: DC | PRN

## 2020-03-16 MED ORDER — METOCLOPRAMIDE HCL 5 MG PO TABS: 5.0000 mg | ORAL_TABLET | Freq: Three times a day (TID) | ORAL | Status: DC | PRN

## 2020-03-16 MED ORDER — CHLORHEXIDINE GLUCONATE 4 % EX LIQD: 60.0000 mL | Freq: Once | CUTANEOUS | Status: DC

## 2020-03-16 MED ORDER — BUPIVACAINE IN DEXTROSE 0.75-8.25 % IT SOLN
INTRATHECAL | Status: DC | PRN
  Administered 2020-03-16: 1.5 mL via INTRATHECAL

## 2020-03-16 MED ORDER — DEXAMETHASONE SODIUM PHOSPHATE 10 MG/ML IJ SOLN
10.0000 mg | Freq: Once | INTRAMUSCULAR | Status: AC
  Administered 2020-03-16: 10:00:00 6 mg via INTRAVENOUS

## 2020-03-16 MED ORDER — KETOROLAC TROMETHAMINE 30 MG/ML IJ SOLN
INTRAMUSCULAR | Status: AC
  Filled 2020-03-16: qty 1

## 2020-03-16 MED ORDER — SODIUM CHLORIDE 0.9 % IV SOLN: INTRAVENOUS | Status: DC

## 2020-03-16 SURGICAL SUPPLY — 64 items
ADH SKN CLS APL DERMABOND .7 (GAUZE/BANDAGES/DRESSINGS) ×1
ATTUNE MED ANAT PAT 35 KNEE (Knees) ×1 IMPLANT
ATTUNE MED ANAT PAT 35MM KNEE (Knees) ×1 IMPLANT
ATTUNE PSFEM RTSZ4 NARCEM KNEE (Femur) ×2 IMPLANT
ATTUNE PSRP INSR SZ4 5 KNEE (Insert) ×1 IMPLANT
ATTUNE PSRP INSR SZ4 5MM KNEE (Insert) ×1 IMPLANT
BAG SPEC THK2 15X12 ZIP CLS (MISCELLANEOUS)
BAG ZIPLOCK 12X15 (MISCELLANEOUS) IMPLANT
BASEPLATE TIBIAL ROTATING SZ 4 (Knees) ×2 IMPLANT
BLADE SAW SGTL 11.0X1.19X90.0M (BLADE) IMPLANT
BLADE SAW SGTL 13.0X1.19X90.0M (BLADE) ×3 IMPLANT
BLADE SURG SZ10 CARB STEEL (BLADE) ×6 IMPLANT
BNDG ELASTIC 6X5.8 VLCR STR LF (GAUZE/BANDAGES/DRESSINGS) ×3 IMPLANT
BOWL SMART MIX CTS (DISPOSABLE) ×3 IMPLANT
BSPLAT TIB 4 CMNT ROT PLAT STR (Knees) ×1 IMPLANT
CEMENT HV SMART SET (Cement) ×4 IMPLANT
COVER SURGICAL LIGHT HANDLE (MISCELLANEOUS) ×3 IMPLANT
COVER WAND RF STERILE (DRAPES) IMPLANT
CUFF TOURN SGL QUICK 34 (TOURNIQUET CUFF) ×3
CUFF TRNQT CYL 34X4.125X (TOURNIQUET CUFF) ×1 IMPLANT
DECANTER SPIKE VIAL GLASS SM (MISCELLANEOUS) ×6 IMPLANT
DERMABOND ADVANCED (GAUZE/BANDAGES/DRESSINGS) ×2
DERMABOND ADVANCED .7 DNX12 (GAUZE/BANDAGES/DRESSINGS) ×1 IMPLANT
DRAPE U-SHAPE 47X51 STRL (DRAPES) ×3 IMPLANT
DRESSING AQUACEL AG SP 3.5X10 (GAUZE/BANDAGES/DRESSINGS) ×1 IMPLANT
DRSG AQUACEL AG SP 3.5X10 (GAUZE/BANDAGES/DRESSINGS) ×3
DURAPREP 26ML APPLICATOR (WOUND CARE) ×6 IMPLANT
ELECT REM PT RETURN 15FT ADLT (MISCELLANEOUS) ×3 IMPLANT
GLOVE BIO SURGEON STRL SZ 6 (GLOVE) ×3 IMPLANT
GLOVE BIOGEL PI IND STRL 6.5 (GLOVE) ×1 IMPLANT
GLOVE BIOGEL PI IND STRL 7.5 (GLOVE) ×1 IMPLANT
GLOVE BIOGEL PI IND STRL 8.5 (GLOVE) ×1 IMPLANT
GLOVE BIOGEL PI INDICATOR 6.5 (GLOVE) ×2
GLOVE BIOGEL PI INDICATOR 7.5 (GLOVE) ×2
GLOVE BIOGEL PI INDICATOR 8.5 (GLOVE) ×2
GLOVE ECLIPSE 8.0 STRL XLNG CF (GLOVE) ×3 IMPLANT
GLOVE ORTHO TXT STRL SZ7.5 (GLOVE) ×3 IMPLANT
GOWN STRL REUS W/ TWL LRG LVL3 (GOWN DISPOSABLE) ×1 IMPLANT
GOWN STRL REUS W/TWL 2XL LVL3 (GOWN DISPOSABLE) ×3 IMPLANT
GOWN STRL REUS W/TWL LRG LVL3 (GOWN DISPOSABLE) ×6 IMPLANT
HANDPIECE INTERPULSE COAX TIP (DISPOSABLE) ×3
HOLDER FOLEY CATH W/STRAP (MISCELLANEOUS) IMPLANT
KIT TURNOVER KIT A (KITS) IMPLANT
MANIFOLD NEPTUNE II (INSTRUMENTS) ×3 IMPLANT
NDL SAFETY ECLIPSE 18X1.5 (NEEDLE) IMPLANT
NEEDLE HYPO 18GX1.5 SHARP (NEEDLE)
NS IRRIG 1000ML POUR BTL (IV SOLUTION) ×3 IMPLANT
PACK TOTAL KNEE CUSTOM (KITS) ×3 IMPLANT
PENCIL SMOKE EVACUATOR (MISCELLANEOUS) IMPLANT
PIN DRILL FIX HALF THREAD (BIT) ×2 IMPLANT
PIN FIX SIGMA LCS THRD HI (PIN) ×2 IMPLANT
PROTECTOR NERVE ULNAR (MISCELLANEOUS) ×3 IMPLANT
SET HNDPC FAN SPRY TIP SCT (DISPOSABLE) ×1 IMPLANT
SET PAD KNEE POSITIONER (MISCELLANEOUS) ×3 IMPLANT
SUT MNCRL AB 4-0 PS2 18 (SUTURE) ×3 IMPLANT
SUT STRATAFIX PDS+ 0 24IN (SUTURE) ×3 IMPLANT
SUT VIC AB 1 CT1 36 (SUTURE) ×3 IMPLANT
SUT VIC AB 2-0 CT1 27 (SUTURE) ×9
SUT VIC AB 2-0 CT1 TAPERPNT 27 (SUTURE) ×3 IMPLANT
SYR 3ML LL SCALE MARK (SYRINGE) ×3 IMPLANT
TRAY FOLEY MTR SLVR 16FR STAT (SET/KITS/TRAYS/PACK) ×3 IMPLANT
WATER STERILE IRR 1000ML POUR (IV SOLUTION) ×6 IMPLANT
WRAP KNEE MAXI GEL POST OP (GAUZE/BANDAGES/DRESSINGS) ×3 IMPLANT
YANKAUER SUCT BULB TIP 10FT TU (MISCELLANEOUS) ×3 IMPLANT

## 2020-03-16 NOTE — Transfer of Care (Signed)
Immediate Anesthesia Transfer of Care Note  Patient: Katie Daniel  Procedure(s) Performed: TOTAL KNEE ARTHROPLASTY (Right Knee)  Patient Location: PACU  Anesthesia Type:MAC and Spinal  Level of Consciousness: awake, alert , oriented and patient cooperative  Airway & Oxygen Therapy: Patient Spontanous Breathing and Patient connected to face mask oxygen  Post-op Assessment: Report given to RN and Post -op Vital signs reviewed and stable  Post vital signs: Reviewed and stable  Last Vitals:  Vitals Value Taken Time  BP    Temp    Pulse 77 03/16/20 1129  Resp 19 03/16/20 1128  SpO2 100 % 03/16/20 1129  Vitals shown include unvalidated device data.  Last Pain:  Vitals:   03/16/20 0856  TempSrc:   PainSc: 0-No pain      Patients Stated Pain Goal: 4 (00/37/94 4461)  Complications: No apparent anesthesia complications

## 2020-03-16 NOTE — Anesthesia Postprocedure Evaluation (Signed)
Anesthesia Post Note  Patient: Katie Daniel  Procedure(s) Performed: TOTAL KNEE ARTHROPLASTY (Right Knee)     Patient location during evaluation: PACU Anesthesia Type: Spinal Level of consciousness: awake and alert Pain management: pain level controlled Vital Signs Assessment: post-procedure vital signs reviewed and stable Respiratory status: spontaneous breathing and respiratory function stable Cardiovascular status: blood pressure returned to baseline and stable Postop Assessment: spinal receding Anesthetic complications: no    Last Vitals:  Vitals:   03/16/20 1215 03/16/20 1230  BP: (!) 146/90 133/81  Pulse: 68 77  Resp: 17 15  Temp:    SpO2: 100% 100%    Last Pain:  Vitals:   03/16/20 1230  TempSrc:   PainSc: 0-No pain                 Tiajuana Amass

## 2020-03-16 NOTE — Progress Notes (Signed)
AssistedDr. Rob Fitzgerald with right, ultrasound guided, adductor canal block. Side rails up, monitors on throughout procedure. See vital signs in flow sheet. Tolerated Procedure well.  

## 2020-03-16 NOTE — Anesthesia Procedure Notes (Addendum)
Procedure Name: MAC Date/Time: 03/16/2020 9:36 AM Performed by: West Pugh, CRNA Pre-anesthesia Checklist: Patient identified, Emergency Drugs available, Suction available, Patient being monitored and Timeout performed Patient Re-evaluated:Patient Re-evaluated prior to induction Oxygen Delivery Method: Simple face mask and Nasal cannula Preoxygenation: Pre-oxygenation with 100% oxygen Induction Type: IV induction Placement Confirmation: positive ETCO2 Dental Injury: Teeth and Oropharynx as per pre-operative assessment

## 2020-03-16 NOTE — Discharge Instructions (Signed)

## 2020-03-16 NOTE — Evaluation (Signed)
Physical Therapy Evaluation Patient Details Name: Katie Daniel MRN: [id number] DOB: 04-11-1946 Today's Date: 03/16/2020   History of Present Illness  R TKA  Clinical Impression  Pt is s/p TKA resulting in the deficits listed below (see PT Problem List). Pt ambulated 40' with RW. Initiated TKA HEP. Good progress expected. Noted resting tremor, pt's daughter stated this is baseline when pt is anxious.  Pt will benefit from skilled PT to increase their independence and safety with mobility to allow discharge to the venue listed below.      Follow Up Recommendations Follow surgeon's recommendation for DC plan and follow-up therapies;Outpatient PT    Equipment Recommendations  Rolling walker with 5" wheels    Recommendations for Other Services       Precautions / Restrictions Precautions Precautions: Knee Precaution Booklet Issued: Yes (comment) Precaution Comments: reviewed no pillow under knee Restrictions Weight Bearing Restrictions: No Other Position/Activity Restrictions: WBAT      Mobility  Bed Mobility Overal bed mobility: Needs Assistance Bed Mobility: Supine to Sit     Supine to sit: Min assist     General bed mobility comments: min A to raise trunk and support RLE  Transfers Overall transfer level: Needs assistance Equipment used: Rolling walker (2 wheeled) Transfers: Sit to/from Stand Sit to Stand: Min assist         General transfer comment: Min A to rise, VCs hand placement  Ambulation/Gait Ambulation/Gait assistance: Min guard Gait Distance (Feet): 40 Feet Assistive device: Rolling walker (2 wheeled) Gait Pattern/deviations: Step-to pattern;Decreased step length - right;Decreased step length - left Gait velocity: decr   General Gait Details: VCs hand placement, no loss of balance  Stairs            Wheelchair Mobility    Modified Rankin (Stroke Patients Only)       Balance Overall balance assessment: Modified Independent                                            Pertinent Vitals/Pain Pain Assessment: 0-10 Pain Score: 2  Pain Location: R knee Pain Descriptors / Indicators: Aching Pain Intervention(s): Limited activity within patient's tolerance;Monitored during session;Premedicated before session;Ice applied    Home Living Family/patient expects to be discharged to:: Private residence Living Arrangements: Alone Available Help at Discharge: Family;Available 24 hours/day   Home Access: Level entry     Home Layout: One level Home Equipment: Cane - single point;Bedside commode      Prior Function Level of Independence: Independent         Comments: daughter Katie Daniel to stay with pt 2 weeks     Hand Dominance        Extremity/Trunk Assessment   Upper Extremity Assessment Upper Extremity Assessment: Overall WFL for tasks assessed    Lower Extremity Assessment Lower Extremity Assessment: RLE deficits/detail RLE Deficits / Details: knee AAROM 5-35*, SLR +2/5 RLE Sensation: WNL RLE Coordination: WNL    Cervical / Trunk Assessment Cervical / Trunk Assessment: Normal  Communication   Communication: No difficulties  Cognition Arousal/Alertness: Awake/alert Behavior During Therapy: WFL for tasks assessed/performed Overall Cognitive Status: Within Functional Limits for tasks assessed                                        General  Comments      Exercises Total Joint Exercises Ankle Circles/Pumps: AROM;Both;10 reps;Supine Quad Sets: AROM;Right;5 reps;Supine Heel Slides: AAROM;Right;10 reps;Supine Goniometric ROM: 5-35* AAROM R knee   Assessment/Plan    PT Assessment Patient needs continued PT services  PT Problem List Decreased strength;Decreased activity tolerance;Pain;Decreased knowledge of use of DME;Decreased mobility       PT Treatment Interventions DME instruction;Gait training;Functional mobility training;Therapeutic exercise;Therapeutic  activities;Patient/family education    PT Goals (Current goals can be found in the Care Plan section)  Acute Rehab PT Goals Patient Stated Goal: walk better PT Goal Formulation: With patient/family Time For Goal Achievement: 03/23/20 Potential to Achieve Goals: Good    Frequency 7X/week   Barriers to discharge        Co-evaluation               AM-PAC PT "6 Clicks" Mobility  Outcome Measure Help needed turning from your back to your side while in a flat bed without using bedrails?: A Little Help needed moving from lying on your back to sitting on the side of a flat bed without using bedrails?: A Little Help needed moving to and from a bed to a chair (including a wheelchair)?: A Little Help needed standing up from a chair using your arms (e.g., wheelchair or bedside chair)?: A Little Help needed to walk in hospital room?: A Little Help needed climbing 3-5 steps with a railing? : A Lot 6 Click Score: 17    End of Session Equipment Utilized During Treatment: Gait belt Activity Tolerance: Patient tolerated treatment well Patient left: in chair Nurse Communication: Mobility status PT Visit Diagnosis: Muscle weakness (generalized) (M62.81);Difficulty in walking, not elsewhere classified (R26.2);Pain Pain - Right/Left: Right Pain - part of body: Knee    Time: [id number] PT Time Calculation (min) (ACUTE ONLY): 37 min   Charges:   PT Evaluation $PT Eval Low Complexity: 1 Low PT Treatments $Gait Training: 8-22 mins        Katie Daniel PT 03/16/2020  Acute Rehabilitation Services Pager [phone] Office [phone]

## 2020-03-16 NOTE — Anesthesia Preprocedure Evaluation (Signed)
Anesthesia Evaluation  Patient identified by MRN, date of birth, ID band Patient awake    Reviewed: Allergy & Precautions, NPO status , Patient's Chart, lab work & pertinent test results  Airway Mallampati: II  TM Distance: >3 FB     Dental  (+) Dental Advisory Given   Pulmonary neg pulmonary ROS,    breath sounds clear to auscultation       Cardiovascular negative cardio ROS   Rhythm:Regular Rate:Normal     Neuro/Psych parkinsons    GI/Hepatic negative GI ROS, Neg liver ROS,   Endo/Other  negative endocrine ROS  Renal/GU negative Renal ROS     Musculoskeletal  (+) Arthritis ,   Abdominal   Peds  Hematology negative hematology ROS (+)   Anesthesia Other Findings   Reproductive/Obstetrics                             Anesthesia Physical Anesthesia Plan  ASA: III  Anesthesia Plan: Spinal   Post-op Pain Management:  Regional for Post-op pain   Induction:   PONV Risk Score and Plan: 2 and Propofol infusion, Ondansetron and Treatment may vary due to age or medical condition  Airway Management Planned: Natural Airway and Simple Face Mask  Additional Equipment:   Intra-op Plan:   Post-operative Plan:   Informed Consent: I have reviewed the patients History and Physical, chart, labs and discussed the procedure including the risks, benefits and alternatives for the proposed anesthesia with the patient or authorized representative who has indicated his/her understanding and acceptance.       Plan Discussed with: CRNA  Anesthesia Plan Comments:         Anesthesia Quick Evaluation

## 2020-03-16 NOTE — Op Note (Signed)
NAME:  Katie Daniel                      MEDICAL RECORD NO.:  [id number]                             FACILITY:  [facility]      PHYSICIAN:  Pietro Cassis. Alvan Dame, M.D.  DATE OF BIRTH:  02/16/1946      DATE OF PROCEDURE:  03/16/2020                                     OPERATIVE REPORT         PREOPERATIVE DIAGNOSIS:  Right knee osteoarthritis.      POSTOPERATIVE DIAGNOSIS:  Right knee osteoarthritis.      FINDINGS:  The patient was noted to have complete loss of cartilage and   bone-on-bone arthritis with associated osteophytes in the lateral and patellofemoral compartments of   the knee with a significant synovitis and associated effusion.  The patient had failed months of conservative treatment including medications, injection therapy, activity modification.     PROCEDURE:  Right total knee replacement.      COMPONENTS USED:  DePuy Attune rotating platform posterior stabilized knee   system, a size 4N femur, 4 tibia, size 5 mm PS AOX insert, and 35 anatomic patellar   button.      SURGEON:  Pietro Cassis. Alvan Dame, M.D.      ASSISTANT:  Griffith Citron, PA-C.      ANESTHESIA:  Regional and Spinal.      SPECIMENS:  None.      COMPLICATION:  None.      DRAINS:  None.  EBL: <100 cc      TOURNIQUET TIME:   Total Tourniquet Time Documented: Thigh (Right) - 29 minutes Total: Thigh (Right) - 29 minutes  .      The patient was stable to the recovery room.      INDICATION FOR PROCEDURE:  Katie Daniel is a 74 y.o. female patient of   mine.  The patient had been seen, evaluated, and treated for months conservatively in the   office with medication, activity modification, and injections.  The patient had   radiographic changes of bone-on-bone arthritis with endplate sclerosis and osteophytes noted.  Based on the radiographic changes and failed conservative measures, the patient   decided to proceed with definitive treatment, total knee replacement.  Risks of infection, DVT, component  failure, need for revision surgery, neurovascular injury were reviewed in the office setting.  The postop course was reviewed stressing the efforts to maximize post-operative satisfaction and function.  Consent was obtained for benefit of pain   relief.      PROCEDURE IN DETAIL:  The patient was brought to the operative theater.   Once adequate anesthesia, preoperative antibiotics, 2 gm of Ancef,1 gm of Tranexamic Acid, and 10 mg of Decadron administered, the patient was positioned supine with a right thigh tourniquet placed.  The  right lower extremity was prepped and draped in sterile fashion.  A time-   out was performed identifying the patient, planned procedure, and the appropriate extremity.      The right lower extremity was placed in the [facility] leg holder.  The leg was   exsanguinated, tourniquet elevated to 250 mmHg.  A midline incision was  made followed by median parapatellar arthrotomy.  Following initial   exposure, attention was first directed to the patella.  Precut   measurement was noted to be 22 mm.  I resected down to 13-14 mm and used a   35 anatomic patellar button to restore patellar height as well as cover the cut surface.      The lug holes were drilled and a metal shim was placed to protect the   patella from retractors and saw blade during the procedure.      At this point, attention was now directed to the femur.  The femoral   canal was opened with a drill, irrigated to try to prevent fat emboli.  An   intramedullary rod was passed at 3 degrees valgus, 9 mm of bone was   resected off the distal femur.  Following this resection, the tibia was   subluxated anteriorly.  Using the extramedullary guide, 2 mm of bone was resected off   the proximal medial tibia.  We confirmed the gap would be   stable medially and laterally with a size 5 spacer block as well as confirmed that the tibial cut was perpendicular in the coronal plane, checking with an alignment rod.       Once this was done, I sized the femur to be a size 4 in the anterior-   posterior dimension, chose a narrow component based on medial and   lateral dimension.  The size 4 rotation block was then pinned in   position anterior referenced using the C-clamp to set rotation.  The   anterior, posterior, and  chamfer cuts were made without difficulty nor   notching making certain that I was along the anterior cortex to help   with flexion gap stability.      The final box cut was made off the lateral aspect of distal femur.      At this point, the tibia was sized to be a size 4.  The size 4 tray was   then pinned in position through the medial third of the tubercle,   drilled, and keel punched.  Trial reduction was now carried with a 4 femur,  4 tibia, a size 5 mm PS insert, and the 35 anatomic patella botton.  The knee was brought to full extension with good flexion stability with the patella   tracking through the trochlea without application of pressure.  Given   all these findings the trial components removed.  Final components were   opened and cement was mixed.  The knee was irrigated with normal saline solution and pulse lavage.  The synovial lining was   then injected with 30 cc of 0.25% Marcaine with epinephrine, 1 cc of Toradol and 30 cc of NS for a total of 61 cc.     Final implants were then cemented onto cleaned and dried cut surfaces of bone with the knee brought to extension with a size 5 mm PS trial insert.      Once the cement had fully cured, excess cement was removed   throughout the knee.  I confirmed that I was satisfied with the range of   motion and stability, and the final size 5 mm PS AOX insert was chosen.  It was   placed into the knee.      The tourniquet had been let down at 29 minutes.  No significant   hemostasis was required.  The extensor mechanism was then reapproximated using #1 Vicryl  and #1 Stratafix sutures with the knee   in flexion.  The   remaining wound  was closed with 2-0 Vicryl and running 4-0 Monocryl.   The knee was cleaned, dried, dressed sterilely using Dermabond and   Aquacel dressing.  The patient was then   brought to recovery room in stable condition, tolerating the procedure   well.   Please note that Physician Assistant, Griffith Citron, PA-C was present for the entirety of the case, and was utilized for pre-operative positioning, peri-operative retractor management, general facilitation of the procedure and for primary wound closure at the end of the case.              Pietro Cassis Alvan Dame, M.D.    03/16/2020 11:01 AM

## 2020-03-16 NOTE — Anesthesia Procedure Notes (Signed)
Spinal  Patient location during procedure: OR Start time: 03/16/2020 9:39 AM End time: 03/16/2020 9:44 AM Reason for block: at surgeon's request Staffing Performed: resident/CRNA  Anesthesiologist: Suzette Battiest, MD Resident/CRNA: West Pugh, CRNA Preanesthetic Checklist Completed: patient identified, IV checked, site marked, risks and benefits discussed, surgical consent, monitors and equipment checked, pre-op evaluation and timeout performed Spinal Block Patient position: sitting Prep: DuraPrep Patient monitoring: heart rate, continuous pulse ox and blood pressure Approach: midline Location: L3-4 Injection technique: single-shot Needle Needle type: Pencan  Needle gauge: 24 G Needle length: 9 cm Assessment Sensory level: T6 Additional Notes Expiration of kit checked and confirmed. Patient tolerated procedure well,without complications with noted clear CSF. Loss of motor and sensory on exam post injection. Dr Ola Spurr present for procedure.

## 2020-03-16 NOTE — Anesthesia Procedure Notes (Signed)
Anesthesia Regional Block: Adductor canal block   Pre-Anesthetic Checklist: ,, timeout performed, Correct Patient, Correct Site, Correct Laterality, Correct Procedure, Correct Position, site marked, Risks and benefits discussed,  Surgical consent,  Pre-op evaluation,  At surgeon's request and post-op pain management  Laterality: Right  Prep: chloraprep       Needles:  Injection technique: Single-shot  Needle Type: Echogenic Needle     Needle Length: 9cm  Needle Gauge: 21     Additional Needles:   Procedures:,,,, ultrasound used (permanent image in chart),,,,  Narrative:  Start time: 03/16/2020 8:45 AM End time: 03/16/2020 8:53 AM Injection made incrementally with aspirations every 5 mL.  Performed by: Personally  Anesthesiologist: Suzette Battiest, MD

## 2020-03-16 NOTE — Interval H&P Note (Signed)
History and Physical Interval Note:  03/16/2020 8:35 AM  Katie Daniel  has presented today for surgery, with the diagnosis of Right knee osteoarthritis.  The various methods of treatment have been discussed with the patient and family. After consideration of risks, benefits and other options for treatment, the patient has consented to  Procedure(s) with comments: TOTAL KNEE ARTHROPLASTY (Right) - 70 mins as a surgical intervention.  The patient's history has been reviewed, patient examined, no change in status, stable for surgery.  I have reviewed the patient's chart and labs.  Questions were answered to the patient's satisfaction.     Mauri Pole

## 2020-03-17 ENCOUNTER — Encounter: Payer: Self-pay | Admitting: *Deleted

## 2020-03-17 DIAGNOSIS — M1711 Unilateral primary osteoarthritis, right knee: Secondary | ICD-10-CM | POA: Diagnosis not present

## 2020-03-17 LAB — CBC
HCT: 37.2 % (ref 36.0–46.0)
Hemoglobin: 11.6 g/dL — ABNORMAL LOW (ref 12.0–15.0)
MCH: 29.7 pg (ref 26.0–34.0)
MCHC: 31.2 g/dL (ref 30.0–36.0)
MCV: 95.1 fL (ref 80.0–100.0)
Platelets: 221 10*3/uL (ref 150–400)
RBC: 3.91 MIL/uL (ref 3.87–5.11)
RDW: 13.8 % (ref 11.5–15.5)
WBC: 13.2 10*3/uL — ABNORMAL HIGH (ref 4.0–10.5)
nRBC: 0 % (ref 0.0–0.2)

## 2020-03-17 LAB — BASIC METABOLIC PANEL
Anion gap: 7 (ref 5–15)
BUN: 16 mg/dL (ref 8–23)
CO2: 28 mmol/L (ref 22–32)
Calcium: 8.7 mg/dL — ABNORMAL LOW (ref 8.9–10.3)
Chloride: 106 mmol/L (ref 98–111)
Creatinine, Ser: 0.62 mg/dL (ref 0.44–1.00)
GFR calc Af Amer: >60 mL/min (ref >60)
GFR calc non Af Amer: >60 mL/min (ref >60)
Glucose, Bld: 113 mg/dL — ABNORMAL HIGH (ref 70–99)
Potassium: 4.2 mmol/L (ref 3.5–5.1)
Sodium: 141 mmol/L (ref 135–145)

## 2020-03-17 LAB — GLUCOSE, CAPILLARY: Glucose-Capillary: 114 mg/dL — ABNORMAL HIGH (ref 70–99)

## 2020-03-17 MED ORDER — ASPIRIN 81 MG PO CHEW: 81.0000 mg | CHEWABLE_TABLET | Freq: Two times a day (BID) | ORAL | 0 refills | Status: AC

## 2020-03-17 MED ORDER — DOCUSATE SODIUM 100 MG PO CAPS: 100.0000 mg | ORAL_CAPSULE | Freq: Two times a day (BID) | ORAL | 0 refills | Status: DC

## 2020-03-17 MED ORDER — FERROUS SULFATE 325 (65 FE) MG PO TABS: 325.0000 mg | ORAL_TABLET | Freq: Three times a day (TID) | ORAL | 0 refills | Status: DC

## 2020-03-17 MED ORDER — HYDROCODONE-ACETAMINOPHEN 5-325 MG PO TABS: 1.0000 | ORAL_TABLET | ORAL | 0 refills | Status: DC | PRN

## 2020-03-17 MED ORDER — METHOCARBAMOL 500 MG PO TABS: 500.0000 mg | ORAL_TABLET | Freq: Four times a day (QID) | ORAL | 0 refills | Status: DC | PRN

## 2020-03-17 MED ORDER — CELECOXIB 200 MG PO CAPS: 200.0000 mg | ORAL_CAPSULE | Freq: Two times a day (BID) | ORAL | 0 refills | Status: DC

## 2020-03-17 MED ORDER — POLYETHYLENE GLYCOL 3350 17 G PO PACK: 17.0000 g | PACK | Freq: Two times a day (BID) | ORAL | 0 refills | Status: DC

## 2020-03-17 MED ORDER — SODIUM CHLORIDE 0.9 % IV BOLUS: 500.0000 mL | Freq: Once | INTRAVENOUS | Status: DC

## 2020-03-17 NOTE — Plan of Care (Signed)
  Problem: Education: Goal: Knowledge of General Education information will improve Description: Including pain rating scale, medication(s)/side effects and non-pharmacologic comfort measures Outcome: Progressing   Problem: Coping: Goal: Level of anxiety will decrease Outcome: Progressing   Problem: Pain Managment: Goal: General experience of comfort will improve Outcome: Progressing   Problem: Education: Goal: Knowledge of the prescribed therapeutic regimen will improve Outcome: Progressing

## 2020-03-17 NOTE — Plan of Care (Signed)
resolved 

## 2020-03-17 NOTE — Progress Notes (Signed)
Physical Therapy Treatment Patient Details Name: Katie Daniel MRN: [id number] DOB: 26-Feb-1946 Today's Date: 03/17/2020    History of Present Illness Patient is 74 y.o. female s/p Rt TKA on 03/16/20.     PT Comments    Patient progressed mobility this session and increased ambulation distance to 170 feet with RW. Pt requires min guard assist and continues to have slow gait pattern but maintains safe proximity to walker throughout. Pt was educated on safe stair mobility with side step technique using single hand rail to ascend/descend 1 step. Pt's daughter demonstrated safe guarding position for stair mobility and pt verbablized safe step sequencing "up with good, down with bad". Reviewed additional HEP exercises and icing. Patient has met mobility at safe level for discharge home with assistance from her daughter. Acute PT will continue to follow and progress during acute stay.   Follow Up Recommendations  Follow surgeon's recommendation for DC plan and follow-up therapies;Outpatient PT     Equipment Recommendations  Rolling walker with 5" wheels    Recommendations for Other Services       Precautions / Restrictions Precautions Precautions: Fall Restrictions Weight Bearing Restrictions: No Other Position/Activity Restrictions: WBAT    Mobility  Bed Mobility      General bed mobility comments: pt in recliner at start  Transfers Overall transfer level: Needs assistance Equipment used: Rolling walker (2 wheeled) Transfers: Sit to/from Stand Sit to Stand: Min guard         General transfer comment: cues required for hand placement. Pt preferring to use bil UE for power up, pt steady with rising to RW.  Ambulation/Gait Ambulation/Gait assistance: Min guard Gait Distance (Feet): 170 Feet Assistive device: Rolling walker (2 wheeled) Gait Pattern/deviations: Step-to pattern;Decreased step length - right;Decreased step length - left;Decreased stride length Gait velocity:  decreased   General Gait Details: pt with safe hand placement, cues for step pattern, no overt LOB. Pt taking short step length and cues to increased slightly.    Stairs             Wheelchair Mobility    Modified Rankin (Stroke Patients Only)       Balance Overall balance assessment: Mild deficits observed, not formally tested           Cognition Arousal/Alertness: Awake/alert Behavior During Therapy: WFL for tasks assessed/performed Overall Cognitive Status: Within Functional Limits for tasks assessed           Exercises Total Joint Exercises Long Arc Quad: Right;AROM;10 reps;Seated Knee Flexion: AROM;AAROM;5 reps;Right;Seated    General Comments        Pertinent Vitals/Pain Pain Assessment: 0-10 Pain Score: 5  Pain Location: R knee Pain Descriptors / Indicators: Aching Pain Intervention(s): Limited activity within patient's tolerance;Monitored during session;Repositioned;Ice applied           PT Goals (current goals can now be found in the care plan section) Acute Rehab PT Goals Patient Stated Goal: walk better PT Goal Formulation: With patient/family Time For Goal Achievement: 03/23/20 Potential to Achieve Goals: Good Progress towards PT goals: Progressing toward goals    Frequency    7X/week      PT Plan Current plan remains appropriate       AM-PAC PT "6 Clicks" Mobility   Outcome Measure  Help needed turning from your back to your side while in a flat bed without using bedrails?: A Little Help needed moving from lying on your back to sitting on the side of a flat bed without using  bedrails?: A Little Help needed moving to and from a bed to a chair (including a wheelchair)?: A Little Help needed standing up from a chair using your arms (e.g., wheelchair or bedside chair)?: A Little Help needed to walk in hospital room?: A Little Help needed climbing 3-5 steps with a railing? : A Little 6 Click Score: 18    End of Session  Equipment Utilized During Treatment: Gait belt Activity Tolerance: Patient tolerated treatment well Patient left: in chair;with chair alarm set;with call bell/phone within reach;with family/visitor present Nurse Communication: Mobility status PT Visit Diagnosis: Muscle weakness (generalized) (M62.81);Difficulty in walking, not elsewhere classified (R26.2);Pain Pain - Right/Left: Right Pain - part of body: Knee     Time: [id number] PT Time Calculation (min) (ACUTE ONLY): 39 min  Charges:  $Gait Training: 23-37 mins $Therapeutic Exercise: 8-22 mins                     Verner Mould, DPT Physical Therapist with [facility] [phone]  03/17/2020 3:15 PM

## 2020-03-17 NOTE — Progress Notes (Signed)
Physical Therapy Treatment Patient Details Name: Katie Daniel MRN: [id number] DOB: 1946-03-01 Today's Date: 03/17/2020    History of Present Illness R TKA    PT Comments    Per RN, pt had vagal episode in recliner earlier this morning. She received a bolus prior to PT session. Pt is progressing well with mobility, she ambulated 56' with RW. Instructed pt in TKA HEP. Will plan second PT session today to complete stair training and to review HEP with pt's daughter once she arrives.    Follow Up Recommendations  Follow surgeon's recommendation for DC plan and follow-up therapies;Outpatient PT     Equipment Recommendations  Rolling walker with 5" wheels    Recommendations for Other Services       Precautions / Restrictions Precautions Precautions: Knee Precaution Booklet Issued: Yes (comment) Precaution Comments: reviewed no pillow under knee Restrictions Weight Bearing Restrictions: No Other Position/Activity Restrictions: WBAT    Mobility  Bed Mobility               General bed mobility comments: up on BSC  Transfers Overall transfer level: Needs assistance Equipment used: Rolling walker (2 wheeled) Transfers: Sit to/from Stand Sit to Stand: Min assist         General transfer comment: Min A to rise, VCs hand placement  Ambulation/Gait Ambulation/Gait assistance: Min guard Gait Distance (Feet): 80 Feet Assistive device: Rolling walker (2 wheeled) Gait Pattern/deviations: Step-to pattern;Decreased step length - right;Decreased step length - left Gait velocity: decr   General Gait Details: VCs hand placement, no loss of balance   Stairs             Wheelchair Mobility    Modified Rankin (Stroke Patients Only)       Balance Overall balance assessment: Modified Independent                                          Cognition Arousal/Alertness: Awake/alert Behavior During Therapy: WFL for tasks  assessed/performed Overall Cognitive Status: Within Functional Limits for tasks assessed                                        Exercises Total Joint Exercises Ankle Circles/Pumps: AROM;Both;10 reps;Supine Quad Sets: AROM;Right;5 reps;Supine Short Arc Quad: AROM;Right;10 reps;Supine Heel Slides: AAROM;Right;10 reps;Supine Hip ABduction/ADduction: AAROM;Right;10 reps;Supine Straight Leg Raises: AAROM;Right;10 reps;Supine    General Comments        Pertinent Vitals/Pain Pain Score: 1  Pain Location: R knee Pain Descriptors / Indicators: Aching Pain Intervention(s): Limited activity within patient's tolerance;Monitored during session;Premedicated before session;Ice applied    Home Living                      Prior Function            PT Goals (current goals can now be found in the care plan section) Acute Rehab PT Goals Patient Stated Goal: walk better PT Goal Formulation: With patient/family Time For Goal Achievement: 03/23/20 Potential to Achieve Goals: Good Progress towards PT goals: Progressing toward goals    Frequency    7X/week      PT Plan Current plan remains appropriate    Co-evaluation              AM-PAC PT "6 Clicks" Mobility  Outcome Measure  Help needed turning from your back to your side while in a flat bed without using bedrails?: A Little Help needed moving from lying on your back to sitting on the side of a flat bed without using bedrails?: A Little Help needed moving to and from a bed to a chair (including a wheelchair)?: A Little Help needed standing up from a chair using your arms (e.g., wheelchair or bedside chair)?: A Little Help needed to walk in hospital room?: A Little Help needed climbing 3-5 steps with a railing? : A Lot 6 Click Score: 17    End of Session Equipment Utilized During Treatment: Gait belt Activity Tolerance: Patient tolerated treatment well Patient left: in chair Nurse  Communication: Mobility status PT Visit Diagnosis: Muscle weakness (generalized) (M62.81);Difficulty in walking, not elsewhere classified (R26.2);Pain Pain - Right/Left: Right Pain - part of body: Knee     Time: [id number] PT Time Calculation (min) (ACUTE ONLY): 21 min  Charges:  $Gait Training: 8-22 mins                     Blondell Reveal Kistler PT 03/17/2020  Acute Rehabilitation Services Pager [phone] Office [phone]

## 2020-03-17 NOTE — Progress Notes (Signed)
Met with pt and daughter briefly to confirm dc arrangements.  Pt's DME, rw and 3n1, delivered to room by [employer] and pt confirms she already has OPPT arranged with [facility].  Pt denies any further needs.  Roswell Ndiaye, LCSW

## 2020-03-17 NOTE — Progress Notes (Signed)
     Subjective: 1 Day Post-Op Procedure(s) (LRB): TOTAL KNEE ARTHROPLASTY (Right)   Patient reports pain as mild, pain controlled with medication.  No reported events throughout the night.  Discussed the procedure as well as expectations moving forward.  Patient is ready to be discharged home, if she does well with therapy.  Patient follow-up in the clinic in 2 weeks.  Patient knows to call with any questions or concerns.   Patient's anticipated LOS is less than 2 midnights, meeting these requirements: - Lives within 1 hour of care - Has a competent adult at home to recover with post-op recover - NO history of  - Chronic pain requiring opiods  - Diabetes  - Coronary Artery Disease  - Heart failure  - Heart attack  - Stroke  - DVT/VTE  - Cardiac arrhythmia  - Respiratory Failure/COPD  - Renal failure  - Anemia  - Advanced Liver disease       Objective:   VITALS:   Vitals:   03/17/20 0137 03/17/20 0552  BP: 125/84 134/70  Pulse: 64 78  Resp: 18 19  Temp: 97.7 F (36.5 C) 98.3 F (36.8 C)  SpO2: 98% 99%    Dorsiflexion/Plantar flexion intact Incision: dressing C/D/I No cellulitis present Compartment soft  LABS Recent Labs    03/17/20 0341  HGB 11.6*  HCT 37.2  WBC 13.2*  PLT 221    Recent Labs    03/17/20 0341  NA 141  K 4.2  BUN 16  CREATININE 0.62  GLUCOSE 113*     Assessment/Plan: 1 Day Post-Op Procedure(s) (LRB): TOTAL KNEE ARTHROPLASTY (Right) Foley cath d/c'ed Advance diet Up with therapy D/C IV fluids Discharge home Follow up in 2 weeks at [facility] Follow up with OLIN,Glennette Galster D in 2 weeks.  Contact information:  [facility] [address], [address] [place] [id number]         Danae Orleans PA-C  [facility]  [place] [address] [place], [place], [place] [address] Phone: [phone] [email or web address] Facebook  [employer]

## 2020-03-17 NOTE — Progress Notes (Signed)
Patient called out saying she felt dizzy initial BP 71/61. Patient was up in chair . Placed in [place]. Patient responding slowly RR called bolus started. Weston Anna PA on floor and orders received.

## 2020-03-22 NOTE — Discharge Summary (Signed)
Physician Discharge Summary  Patient ID: Katie Daniel MRN: [id number] DOB/AGE: [birth year] 75 y.o.  Admit date: 03/16/2020 Discharge date: 03/17/2020   Procedures:  Procedure(s) (LRB): TOTAL KNEE ARTHROPLASTY (Right)  Attending Physician:  Dr. Paralee Cancel   Admission Diagnoses:   Right knee primary OA / pain  Discharge Diagnoses:  Principal Problem:   S/P right TKA  Past Medical History:  Diagnosis Date  . Arthritis   . Essential tremor   . Parkinson disease (Index)   . Squamous cell skin cancer    Scalp and side of nose    HPI:    Katie Daniel, 74 y.o. female, has a history of pain and functional disability in the right knee due to arthritis and has failed non-surgical conservative treatments for greater than 12 weeks to include NSAID's and/or analgesics, corticosteriod injections, viscosupplementation injections, supervised PT with diminished ADL's post treatment, use of assistive devices and activity modification.  Onset of symptoms was abrupt, starting 1 years ago with rapidlly worsening course since that time. The patient noted prior procedures on the knee to include  arthroscopy on the right knee in July 2020.  Patient currently rates pain in the right knee(s) at 9 out of 10 with activity. Patient has worsening of pain with activity and weight bearing, pain that interferes with activities of daily living, pain with passive range of motion, crepitus and joint swelling.  Patient has evidence of periarticular osteophytes and joint space narrowing by imaging studies. There is no active infection.  Risks, benefits and expectations were discussed with the patient.  Risks including but not limited to the risk of anesthesia, blood clots, nerve damage, blood vessel damage, failure of the prosthesis, infection and up to and including death.  Patient understand the risks, benefits and expectations and wishes to proceed with surgery.   PCP: Harlan Stains, MD   Discharged Condition:  good  Hospital Course:  Patient underwent the above stated procedure on 03/16/2020. Patient tolerated the procedure well and brought to the recovery room in good condition and subsequently to the floor.  POD #1 BP: 134/70 ; Pulse: 78 ; Temp: 98.3 F (36.8 C) ; Resp: 19 Patient reports pain as mild, pain controlled with medication.  No reported events throughout the night.  Discussed the procedure as well as expectations moving forward.  Patient is ready to be discharged home. Dorsiflexion/plantar flexion intact, incision: dressing C/D/I, no cellulitis present and compartment soft.   LABS  Basename    HGB     11.6  HCT     37.2    Discharge Exam: General appearance: alert, cooperative and no distress Extremities: Homans sign is negative, no sign of DVT, no edema, redness or tenderness in the calves or thighs and no ulcers, gangrene or trophic changes  Disposition: Home with follow up in 2 weeks   Follow-up Information    Paralee Cancel, MD. Schedule an appointment as soon as possible for a visit in 2 weeks.   Specialty: Orthopedic Surgery Contact information: [address] [place] [address] [phone]           Discharge Instructions    Call MD / Call 911   Complete by: As directed    If you experience chest pain or shortness of breath, CALL 911 and be transported to the hospital emergency room.  If you develope a fever above 101 F, pus (white drainage) or increased drainage or redness at the wound, or calf pain, call your surgeon's  office.   Change dressing   Complete by: As directed    Maintain surgical dressing until follow up in the clinic. If the edges start to pull up, may reinforce with tape. If the dressing is no longer working, may remove and cover with gauze and tape, but must keep the area dry and clean.  Call with any questions or concerns.   Constipation Prevention   Complete by: As directed    Drink plenty of fluids.  Prune juice may be  helpful.  You may use a stool softener, such as Colace (over the counter) 100 mg twice a day.  Use MiraLax (over the counter) for constipation as needed.   Diet - low sodium heart healthy   Complete by: As directed    Discharge instructions   Complete by: As directed    Maintain surgical dressing until follow up in the clinic. If the edges start to pull up, may reinforce with tape. If the dressing is no longer working, may remove and cover with gauze and tape, but must keep the area dry and clean.  Follow up in 2 weeks at [facility]. Call with any questions or concerns.   Increase activity slowly as tolerated   Complete by: As directed    Weight bearing as tolerated with assist device (walker, cane, etc) as directed, use it as long as suggested by your surgeon or therapist, typically at least 4-6 weeks.   TED hose   Complete by: As directed    Use stockings (TED hose) for 2 weeks on both leg(s).  You may remove them at night for sleeping.      Allergies as of 03/17/2020      Reactions   Penicillins Hives   Did it involve swelling of the face/tongue/throat, SOB, or low BP? No Did it involve sudden or severe rash/hives, skin peeling, or any reaction on the inside of your mouth or nose? Yes Did you need to seek medical attention at a hospital or doctor's office? Yes When did it last happen?5 years If all above answers are "NO", may proceed with cephalosporin use. Tolerated ancef 03/16/20      Medication List    STOP taking these medications   acetaminophen 650 MG CR tablet Commonly known as: TYLENOL   ADVIL COLD & SINUS LIQUI-GELS PO   Voltaren 1 % Gel Generic drug: diclofenac Sodium     TAKE these medications   Align 4 MG Caps Take 4 mg by mouth 3 (three) times a week.   aspirin 81 MG chewable tablet Commonly known as: Aspirin Childrens Chew 1 tablet (81 mg total) by mouth 2 (two) times daily. Take for 4 weeks, then resume regular dose.   calcium carbonate 1250 (500  Ca) MG chewable tablet Commonly known as: OS-CAL Chew 2 tablets by mouth daily.   carbidopa-levodopa 25-100 MG tablet Commonly known as: SINEMET IR Take 1 tablet by mouth 4 (four) times daily.   celecoxib 200 MG capsule Commonly known as: CeleBREX Take 1 capsule (200 mg total) by mouth 2 (two) times daily.   cholecalciferol 25 MCG (1000 UNIT) tablet Commonly known as: VITAMIN D3 Take 4,000 Units by mouth daily.   docusate sodium 100 MG capsule Commonly known as: Colace Take 1 capsule (100 mg total) by mouth 2 (two) times daily.   ferrous sulfate 325 (65 FE) MG tablet Commonly known as: FerrouSul Take 1 tablet (325 mg total) by mouth 3 (three) times daily with meals for 14 days.  fluticasone 50 MCG/ACT nasal spray Commonly known as: FLONASE Place 1 spray into both nostrils daily as needed for allergies or rhinitis.   HYDROcodone-acetaminophen 5-325 MG tablet Commonly known as: Norco Take 1-2 tablets by mouth every 4 (four) hours as needed for moderate pain or severe pain.   methocarbamol 500 MG tablet Commonly known as: Robaxin Take 1 tablet (500 mg total) by mouth every 6 (six) hours as needed for muscle spasms.   polyethylene glycol 17 g packet Commonly known as: MIRALAX / GLYCOLAX Take 17 g by mouth 2 (two) times daily.   primidone 50 MG tablet Commonly known as: MYSOLINE Take 50 mg by mouth 2 (two) times daily.   rOPINIRole 8 MG 24 hr tablet Commonly known as: REQUIP XL Take 8 mg by mouth daily.            Discharge Care Instructions  (From admission, onward)         Start     Ordered   03/17/20 0000  Change dressing    Comments: Maintain surgical dressing until follow up in the clinic. If the edges start to pull up, may reinforce with tape. If the dressing is no longer working, may remove and cover with gauze and tape, but must keep the area dry and clean.  Call with any questions or concerns.   03/17/20 0845           Signed: [place] Pugh.  Adalene Gulotta   PA-C  03/22/2020, 8:26 AM

## 2020-04-15 ENCOUNTER — Encounter: Payer: Self-pay | Admitting: Neurology

## 2020-04-19 DIAGNOSIS — G8918 Other acute postprocedural pain: Secondary | ICD-10-CM | POA: Diagnosis not present

## 2020-04-19 DIAGNOSIS — M25561 Pain in right knee: Secondary | ICD-10-CM | POA: Diagnosis not present

## 2020-04-19 DIAGNOSIS — M1711 Unilateral primary osteoarthritis, right knee: Secondary | ICD-10-CM | POA: Diagnosis not present

## 2020-04-19 DIAGNOSIS — Z96651 Presence of right artificial knee joint: Secondary | ICD-10-CM | POA: Diagnosis not present

## 2020-04-21 DIAGNOSIS — M1711 Unilateral primary osteoarthritis, right knee: Secondary | ICD-10-CM | POA: Diagnosis not present

## 2020-04-21 DIAGNOSIS — Z96651 Presence of right artificial knee joint: Secondary | ICD-10-CM | POA: Diagnosis not present

## 2020-04-21 DIAGNOSIS — M25561 Pain in right knee: Secondary | ICD-10-CM | POA: Diagnosis not present

## 2020-04-21 DIAGNOSIS — G8918 Other acute postprocedural pain: Secondary | ICD-10-CM | POA: Diagnosis not present

## 2020-04-26 DIAGNOSIS — M1711 Unilateral primary osteoarthritis, right knee: Secondary | ICD-10-CM | POA: Diagnosis not present

## 2020-04-26 DIAGNOSIS — M25561 Pain in right knee: Secondary | ICD-10-CM | POA: Diagnosis not present

## 2020-04-26 DIAGNOSIS — G8918 Other acute postprocedural pain: Secondary | ICD-10-CM | POA: Diagnosis not present

## 2020-04-26 DIAGNOSIS — Z96651 Presence of right artificial knee joint: Secondary | ICD-10-CM | POA: Diagnosis not present

## 2020-04-28 DIAGNOSIS — Z471 Aftercare following joint replacement surgery: Secondary | ICD-10-CM | POA: Diagnosis not present

## 2020-04-28 DIAGNOSIS — Z96651 Presence of right artificial knee joint: Secondary | ICD-10-CM | POA: Diagnosis not present

## 2020-05-06 DIAGNOSIS — Z96651 Presence of right artificial knee joint: Secondary | ICD-10-CM | POA: Diagnosis not present

## 2020-05-06 DIAGNOSIS — M25561 Pain in right knee: Secondary | ICD-10-CM | POA: Diagnosis not present

## 2020-05-06 DIAGNOSIS — M1711 Unilateral primary osteoarthritis, right knee: Secondary | ICD-10-CM | POA: Diagnosis not present

## 2020-05-06 DIAGNOSIS — G8918 Other acute postprocedural pain: Secondary | ICD-10-CM | POA: Diagnosis not present

## 2020-05-07 NOTE — Progress Notes (Signed)
Assessment/Plan:    1.  ET/PD  -Patient initially diagnosed with Parkinson's disease in 2014 by Dr. Maxine Glenn.  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 Nicki Reaper in 2018. 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  Discussed good prognosis with this  -continue carbidopa/levodopa 25/100, 1.5 tablets at 7 AM/1 tablet at 11 AM/1 tablet at 3 PM/1 tablet at 7 PM  -Add entacapone, 200 mg, 1 tablet with the first 3 dosages of levodopa.  Discussed risk, benefits, and side effects, including discoloration of urine/diarrhea.  If this does not help, will consider opicapone.  -Continue Requip XL, 8 mg daily.  -Continue primidone, 50 mg twice daily.  Not sure that this is helping, but she has a very significant component of essential tremor on the right particularly and would probably need DBS to fix this part.  -Discussed the importance of increasing safe, cardiovascular exercise when she is able.  She just had knee surgery so is in physical therapy for that right now.  -Follow-up 6 months, sooner if new neurologic issues arise.  Subjective:   Katie Daniel was seen today in the movement disorders clinic for neurologic consultation at the request of Harlan Stains, MD.  The consultation is for the evaluation of Parkinson's disease.  Extensive number of records were reviewed, including those from [place] all the way back to 2013.  The patient first saw Dr. Maxine Glenn in 2013.  She had right hand tremor at rest.  She was diagnosed with Parkinson's disease.  She initially started on medication in 2014.  Her first medications were amantadine and Requip XL.  The following information came from chart review: 2014: Amantadine; Requip XL 2015 levodopa added 2018 patient changed care to Dr. Nicki Reaper due to Dr. Maxine Glenn leaving and the diagnosis of essential tremor brought up and patient's diagnosis change/added to include ET/PD, with ET being primary 2019: Primidone  added November/2020: Last visit at [facility] at which point patient's movement disorder medications included:  Carbidopa/levodopa 25/100, 1 tablet at 7 AM/11 AM/3 PM/7 PM  Requip XL, 8 mg daily  Primidone 50 mg, half tablet twice per day (pt reports now on 1 po bid)  She feels that meds wear off up to 45 min before next dose.  She will feel tremor.  She will feel a tightness in her back.  carbidopa/levodopa 25/100 will relieve that tightness.  Specific Symptoms:  Tremor: Yes.  , started in the R hand, some in the L hand.  Notes it in the R hand at rest mostly and mostly when nervous or when ambulating Family hx of similar:  Yes.  , maternal GM with Parkinsons Disease  Voice: no change Sleep: was sleeping well until knee replacement 7 weeks ago  Vivid Dreams:  No.  Acting out dreams:  No. Wet Pillows: No. Postural symptoms:  No.  Falls?  No. Bradykinesia symptoms: shuffling gait but only "occasionally" Loss of smell:  Yes.   but "its never been good" Loss of taste:  No. Urinary Incontinence:  No. Difficulty Swallowing:  No. Handwriting, micrographia: Yes.  , a little Trouble with ADL's:  No.  Trouble buttoning clothing: No. Depression:  No. Memory changes:  No. Hallucinations:  No.  visual distortions: No. N/V:  No. Lightheaded:  No.  Syncope: No. Diplopia:  No. Dyskinesia:  No. Prior exposure to reglan/antipsychotics: No.   Current movement meds:   Carbidopa/levodopa 25/100, 1.5 tablet at 7 AM/ 1 tablet at 11  AM/3 PM/7 PM  Requip XL, 8 mg daily  Primidone 50 mg,  1 po bid   PREVIOUS MEDICATIONS:  amantadine (no help for tremor so d/c); carbidopa/levodopa 25/100; requip xl; primidone  ALLERGIES:   Allergies  Allergen Reactions  . Penicillins Hives    Did it involve swelling of the face/tongue/throat, SOB, or low BP? No Did it involve sudden or severe rash/hives, skin peeling, or any reaction on the inside of your mouth or nose? Yes Did you need to seek medical attention  at a hospital or doctor's office? Yes When did it last happen?5 years If all above answers are "NO", may proceed with cephalosporin use. Tolerated ancef 03/16/20    CURRENT MEDICATIONS:  Current Outpatient Medications  Medication Instructions  . Align 4 mg, Oral, 3 times weekly  . calcium carbonate (OS-CAL) 1250 (500 Ca) MG chewable tablet 2 tablets, Oral, Daily  . carbidopa-levodopa (SINEMET IR) 25-100 MG tablet 1 tablet, Oral, 4 times daily  . celecoxib (CELEBREX) 200 mg, Oral, 2 times daily  . cholecalciferol (VITAMIN D3) 4,000 Units, Oral, Daily  . docusate sodium (COLACE) 100 mg, Oral, 2 times daily  . ferrous sulfate (FERROUSUL) 325 mg, Oral, 3 times daily with meals  . fluticasone (FLONASE) 50 MCG/ACT nasal spray 1 spray, Each Nare, Daily PRN  . HYDROcodone-acetaminophen (NORCO) 5-325 MG tablet 1-2 tablets, Oral, Every 4 hours PRN  . meloxicam (MOBIC) 15 MG tablet meloxicam 15 mg tablet  TAKE 1 TABLET BY MOUTH DAILY WITH FOOD AS NEEDED FOR PAIN  . methocarbamol (ROBAXIN) 500 mg, Oral, Every 6 hours PRN  . polyethylene glycol (MIRALAX / GLYCOLAX) 17 g, Oral, 2 times daily  . primidone (MYSOLINE) 50 mg, Oral, 2 times daily  . rOPINIRole (REQUIP XL) 8 MG 24 hr tablet Take 1 tablet in the morning.  Marland Kitchen rOPINIRole (REQUIP XL) 8 mg, Oral, Daily  . sertraline (ZOLOFT) 25 MG tablet sertraline 25 mg tablet    Objective:   VITALS:   Vitals:   05/11/20 0925  BP: 122/80  Pulse: 70  Resp: 18  SpO2: 98%  Weight: 149 lb (67.6 kg)  Height: 5' 6.5" (1.689 m)    GEN:  The patient appears stated age and is in NAD. HEENT:  Normocephalic, atraumatic.  The mucous membranes are moist. The superficial temporal arteries are without ropiness or tenderness. CV:  RRR Lungs:  CTAB Neck/HEME:  There are no carotid bruits bilaterally.  Neurological examination:  Orientation: The patient is alert and oriented x3.  Cranial nerves: There is good facial symmetry. Extraocular muscles are  intact. The visual fields are full to confrontational testing. The speech is fluent and clear. Soft palate rises symmetrically and there is no tongue deviation. Hearing is intact to conversational tone. Sensation: Sensation is intact to light and pinprick throughout (facial, trunk, extremities). Vibration is intact at the bilateral big toe. There is no extinction with double simultaneous stimulation. There is no sensory dermatomal level identified. Motor: Strength is 5/5 in the bilateral upper and left lower extremities and at least 3/5 in the RLE (does not allow MMT there due to recent surgery) .   Shoulder shrug is equal and symmetric.  There is no pronator drift. Deep tendon reflexes: Deep tendon reflexes are 2/4 at the bilateral biceps, triceps, brachioradialis, left patella (doesn't allow it at right due to recent surgery) and achilles. Plantar responses are downgoing bilaterally.  Movement examination: Tone: There is mild increased tone in the LUE Abnormal movements: there is mild bilateral  UE and LE rest tremor.  There is postural tremor on the R that is moderate to severe.   Coordination:  There is decremation with RAM's, with hand opening and closing on the L Gait and Station: The patient has mild difficulty arising out of a deep-seated chair without the use of the hands (mostly due to recent surgery). The patient's gait is mildly antagic and slightly short stepped due to recent surgery.   I have reviewed and interpreted the following labs independently   Chemistry      Component Value Date/Time   NA 141 03/17/2020 0341   K 4.2 03/17/2020 0341   CL 106 03/17/2020 0341   CO2 28 03/17/2020 0341   BUN 16 03/17/2020 0341   CREATININE 0.62 03/17/2020 0341      Component Value Date/Time   CALCIUM 8.7 (L) 03/17/2020 0341      No results found for: TSH Lab Results  Component Value Date   WBC 13.2 (H) 03/17/2020   HGB 11.6 (L) 03/17/2020   HCT 37.2 03/17/2020   MCV 95.1 03/17/2020    PLT 221 03/17/2020     Total time spent on today's visit was 70 minutes, including both face-to-face time and nonface-to-face time.  Time included that spent on review of records (prior notes available to me/labs/imaging if pertinent), discussing treatment and goals, answering patient's questions and coordinating care.  Cc:  Harlan Stains, MD

## 2020-05-10 DIAGNOSIS — M25561 Pain in right knee: Secondary | ICD-10-CM | POA: Diagnosis not present

## 2020-05-10 DIAGNOSIS — G8918 Other acute postprocedural pain: Secondary | ICD-10-CM | POA: Diagnosis not present

## 2020-05-10 DIAGNOSIS — M1711 Unilateral primary osteoarthritis, right knee: Secondary | ICD-10-CM | POA: Diagnosis not present

## 2020-05-10 DIAGNOSIS — Z96651 Presence of right artificial knee joint: Secondary | ICD-10-CM | POA: Diagnosis not present

## 2020-05-11 ENCOUNTER — Other Ambulatory Visit: Payer: Self-pay

## 2020-05-11 ENCOUNTER — Ambulatory Visit: Payer: Medicare PPO | Admitting: Neurology

## 2020-05-11 ENCOUNTER — Encounter: Payer: Self-pay | Admitting: Neurology

## 2020-05-11 VITALS — BP 122/80 | HR 70 | Resp 18 | Ht 66.5 in | Wt 149.0 lb

## 2020-05-11 DIAGNOSIS — G20 Parkinson's disease: Secondary | ICD-10-CM | POA: Diagnosis not present

## 2020-05-11 DIAGNOSIS — G250 Essential tremor: Secondary | ICD-10-CM | POA: Diagnosis not present

## 2020-05-11 MED ORDER — ENTACAPONE 200 MG PO TABS: 200.0000 mg | ORAL_TABLET | Freq: Three times a day (TID) | ORAL | 1 refills | Status: DC

## 2020-05-11 NOTE — Patient Instructions (Addendum)
1.  Take entacapone 200 mg, 1 tablet with first 3 dosages of carbidopa/levodopa 25/100 (7am/11am/3pm).  This will turn your urine orange 2.  Exercise! 3.  Good to see you today!  The physicians and staff at [facility] are committed to providing excellent care. You may receive a survey requesting feedback about your experience at our office. We strive to receive "very good" responses to the survey questions. If you feel that your experience would prevent you from giving the office a "very good " response, please contact our office to try to remedy the situation. We may be reached at [phone]. Thank you for taking the time out of your busy day to complete the survey.

## 2020-05-12 DIAGNOSIS — M1711 Unilateral primary osteoarthritis, right knee: Secondary | ICD-10-CM | POA: Diagnosis not present

## 2020-05-12 DIAGNOSIS — Z96651 Presence of right artificial knee joint: Secondary | ICD-10-CM | POA: Diagnosis not present

## 2020-05-12 DIAGNOSIS — G8918 Other acute postprocedural pain: Secondary | ICD-10-CM | POA: Diagnosis not present

## 2020-05-12 DIAGNOSIS — M25561 Pain in right knee: Secondary | ICD-10-CM | POA: Diagnosis not present

## 2020-05-17 DIAGNOSIS — S0500XA Injury of conjunctiva and corneal abrasion without foreign body, unspecified eye, initial encounter: Secondary | ICD-10-CM | POA: Diagnosis not present

## 2020-05-18 DIAGNOSIS — M1711 Unilateral primary osteoarthritis, right knee: Secondary | ICD-10-CM | POA: Diagnosis not present

## 2020-05-18 DIAGNOSIS — M25561 Pain in right knee: Secondary | ICD-10-CM | POA: Diagnosis not present

## 2020-05-18 DIAGNOSIS — G8918 Other acute postprocedural pain: Secondary | ICD-10-CM | POA: Diagnosis not present

## 2020-05-18 DIAGNOSIS — Z96651 Presence of right artificial knee joint: Secondary | ICD-10-CM | POA: Diagnosis not present

## 2020-05-20 DIAGNOSIS — G8918 Other acute postprocedural pain: Secondary | ICD-10-CM | POA: Diagnosis not present

## 2020-05-20 DIAGNOSIS — M25561 Pain in right knee: Secondary | ICD-10-CM | POA: Diagnosis not present

## 2020-05-20 DIAGNOSIS — Z96651 Presence of right artificial knee joint: Secondary | ICD-10-CM | POA: Diagnosis not present

## 2020-05-20 DIAGNOSIS — M1711 Unilateral primary osteoarthritis, right knee: Secondary | ICD-10-CM | POA: Diagnosis not present

## 2020-05-24 DIAGNOSIS — H2513 Age-related nuclear cataract, bilateral: Secondary | ICD-10-CM | POA: Diagnosis not present

## 2020-05-25 DIAGNOSIS — M1711 Unilateral primary osteoarthritis, right knee: Secondary | ICD-10-CM | POA: Diagnosis not present

## 2020-05-25 DIAGNOSIS — G8918 Other acute postprocedural pain: Secondary | ICD-10-CM | POA: Diagnosis not present

## 2020-05-25 DIAGNOSIS — M25561 Pain in right knee: Secondary | ICD-10-CM | POA: Diagnosis not present

## 2020-05-25 DIAGNOSIS — Z96651 Presence of right artificial knee joint: Secondary | ICD-10-CM | POA: Diagnosis not present

## 2020-05-27 DIAGNOSIS — G8918 Other acute postprocedural pain: Secondary | ICD-10-CM | POA: Diagnosis not present

## 2020-05-27 DIAGNOSIS — M1711 Unilateral primary osteoarthritis, right knee: Secondary | ICD-10-CM | POA: Diagnosis not present

## 2020-05-27 DIAGNOSIS — M25561 Pain in right knee: Secondary | ICD-10-CM | POA: Diagnosis not present

## 2020-05-27 DIAGNOSIS — Z96651 Presence of right artificial knee joint: Secondary | ICD-10-CM | POA: Diagnosis not present

## 2020-05-31 DIAGNOSIS — M1711 Unilateral primary osteoarthritis, right knee: Secondary | ICD-10-CM | POA: Diagnosis not present

## 2020-05-31 DIAGNOSIS — M25561 Pain in right knee: Secondary | ICD-10-CM | POA: Diagnosis not present

## 2020-05-31 DIAGNOSIS — G8918 Other acute postprocedural pain: Secondary | ICD-10-CM | POA: Diagnosis not present

## 2020-05-31 DIAGNOSIS — Z96651 Presence of right artificial knee joint: Secondary | ICD-10-CM | POA: Diagnosis not present

## 2020-06-03 DIAGNOSIS — M1711 Unilateral primary osteoarthritis, right knee: Secondary | ICD-10-CM | POA: Diagnosis not present

## 2020-06-03 DIAGNOSIS — Z96651 Presence of right artificial knee joint: Secondary | ICD-10-CM | POA: Diagnosis not present

## 2020-06-03 DIAGNOSIS — G8918 Other acute postprocedural pain: Secondary | ICD-10-CM | POA: Diagnosis not present

## 2020-06-03 DIAGNOSIS — M25561 Pain in right knee: Secondary | ICD-10-CM | POA: Diagnosis not present

## 2020-06-09 DIAGNOSIS — M48061 Spinal stenosis, lumbar region without neurogenic claudication: Secondary | ICD-10-CM | POA: Diagnosis not present

## 2020-06-09 DIAGNOSIS — M545 Low back pain: Secondary | ICD-10-CM | POA: Diagnosis not present

## 2020-06-09 DIAGNOSIS — Z96651 Presence of right artificial knee joint: Secondary | ICD-10-CM | POA: Diagnosis not present

## 2020-06-09 DIAGNOSIS — Z471 Aftercare following joint replacement surgery: Secondary | ICD-10-CM | POA: Diagnosis not present

## 2020-07-21 DIAGNOSIS — M545 Low back pain: Secondary | ICD-10-CM | POA: Diagnosis not present

## 2020-07-28 DIAGNOSIS — M545 Low back pain: Secondary | ICD-10-CM | POA: Diagnosis not present

## 2020-07-30 DIAGNOSIS — M545 Low back pain: Secondary | ICD-10-CM | POA: Diagnosis not present

## 2020-08-02 DIAGNOSIS — M545 Low back pain: Secondary | ICD-10-CM | POA: Diagnosis not present

## 2020-08-04 DIAGNOSIS — M545 Low back pain: Secondary | ICD-10-CM | POA: Diagnosis not present

## 2020-08-11 DIAGNOSIS — M545 Low back pain: Secondary | ICD-10-CM | POA: Diagnosis not present

## 2020-08-13 DIAGNOSIS — M545 Low back pain: Secondary | ICD-10-CM | POA: Diagnosis not present

## 2020-08-16 DIAGNOSIS — M545 Low back pain: Secondary | ICD-10-CM | POA: Diagnosis not present

## 2020-08-19 DIAGNOSIS — M545 Low back pain: Secondary | ICD-10-CM | POA: Diagnosis not present

## 2020-08-27 DIAGNOSIS — M48061 Spinal stenosis, lumbar region without neurogenic claudication: Secondary | ICD-10-CM | POA: Diagnosis not present

## 2020-09-06 NOTE — Progress Notes (Signed)
Assessment/Plan:   1. ET/PD             -Patient initially diagnosed with Parkinson's disease in 2014 by Dr. Maxine Glenn.  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 Nicki Reaper in 2018. 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  Discussed good prognosis with this             -continue carbidopa/levodopa 25/100, 1.5 tablets at 7 AM/1 tablet at 11 AM/1 tablet at 3 PM/1 tablet at 7 PM  -discussed diet/protein interactions             -pt really wants to try opicapone, 50 mg q hs (as opposed to entacapone).  Samples given.  RX form filled out.  R/B/SE were discussed.  The opportunity to ask questions was given and they were answered to the best of my ability.  The patient expressed understanding and willingness to follow the outlined treatment protocols.             -Continue Requip XL, 8 mg daily.             -Continue primidone, 50 mg twice daily.  Not sure that this is helping, but she has a very significant component of essential tremor on the right particularly and would probably need DBS to fix this part.   Subjective:   Katie Daniel was seen today in follow up for ET/Parkinsons disease.  My previous records were reviewed prior to todays visit as well as outside records available to me. Pt noting wearing off and stiffness first thing in the AM.  meds wear off after 3  Hours.  In the AM, she will shuffle until AM meds kick in. denies falls.  Pt denies lightheadedness, near syncope.  No hallucinations.  Mood has been good.  Current prescribed movement disorder medications:  Carbidopa/levodopa 25/100, 1.5 tablet at 7 AM/ 1 tablet at 11 AM/3 PM/7 PM Entacapone, 200 mg, 1 tablet with the first 3 dosages of levodopa (added last visit but pt states that she didn't try it) Requip XL, 8 mg daily  Primidone 50 mg,  1 po bid   PREVIOUS MEDICATIONS:  amantadine (no help for tremor so d/c); carbidopa/levodopa 25/100; requip xl; primidone; entacapone -  given but she didn't take it   ALLERGIES:   Allergies  Allergen Reactions  . Penicillins Hives    Did it involve swelling of the face/tongue/throat, SOB, or low BP? No Did it involve sudden or severe rash/hives, skin peeling, or any reaction on the inside of your mouth or nose? Yes Did you need to seek medical attention at a hospital or doctor's office? Yes When did it last happen?5 years If all above answers are "NO", may proceed with cephalosporin use. Tolerated ancef 03/16/20    CURRENT MEDICATIONS:  Outpatient Encounter Medications as of 09/08/2020  Medication Sig  . calcium carbonate (OS-CAL) 1250 (500 Ca) MG chewable tablet Chew 2 tablets by mouth daily.  . carbidopa-levodopa (SINEMET IR) 25-100 MG tablet 1.5 tablet at 7 AM/ 1 tablet at 11 AM/3 PM/7 PM  . cholecalciferol (VITAMIN D3) 25 MCG (1000 UNIT) tablet Take 4,000 Units by mouth daily.  . fluticasone (FLONASE) 50 MCG/ACT nasal spray Place 1 spray into both nostrils daily as needed for allergies or rhinitis.  . meloxicam (MOBIC) 15 MG tablet meloxicam 15 mg tablet  TAKE 1 TABLET BY MOUTH DAILY WITH FOOD AS NEEDED FOR PAIN  . primidone (MYSOLINE) 50  MG tablet Take 50 mg by mouth 2 (two) times daily.  Marland Kitchen rOPINIRole (REQUIP XL) 8 MG 24 hr tablet Take 1 tablet in the morning.  . [DISCONTINUED] carbidopa-levodopa (SINEMET IR) 25-100 MG tablet Take 1 tablet by mouth 4 (four) times daily. Patient is taken 4 and 1/2 tab as directed  . celecoxib (CELEBREX) 200 MG capsule Take 1 capsule (200 mg total) by mouth 2 (two) times daily.  Marland Kitchen docusate sodium (COLACE) 100 MG capsule Take 1 capsule (100 mg total) by mouth 2 (two) times daily.  . ferrous sulfate (FERROUSUL) 325 (65 FE) MG tablet Take 1 tablet (325 mg total) by mouth 3 (three) times daily with meals for 14 days.  Marland Kitchen HYDROcodone-acetaminophen (NORCO) 5-325 MG tablet Take 1-2 tablets by mouth every 4 (four) hours as needed for moderate pain or severe pain.  . methocarbamol  (ROBAXIN) 500 MG tablet Take 1 tablet (500 mg total) by mouth every 6 (six) hours as needed for muscle spasms.  Marland Kitchen Opicapone 50 MG CAPS Take 1 capsule by mouth at bedtime.  . polyethylene glycol (MIRALAX / GLYCOLAX) 17 g packet Take 17 g by mouth 2 (two) times daily.  . Probiotic Product (ALIGN) 4 MG CAPS Take 4 mg by mouth 3 (three) times a week.  Marland Kitchen rOPINIRole (REQUIP XL) 8 MG 24 hr tablet Take 8 mg by mouth daily.  . sertraline (ZOLOFT) 25 MG tablet sertraline 25 mg tablet (Patient not taking: Reported on 09/08/2020)  . [DISCONTINUED] entacapone (COMTAN) 200 MG tablet Take 1 tablet (200 mg total) by mouth 3 (three) times daily. Take with first 3 dosages of carbidopa/levodopa (Patient not taking: Reported on 09/08/2020)   No facility-administered encounter medications on file as of 09/08/2020.    Objective:   PHYSICAL EXAMINATION:    VITALS:   Vitals:   09/08/20 1127  BP: 126/80  Pulse: 74  Resp: 18  SpO2: 98%  Weight: 151 lb (68.5 kg)  Height: 5' 6.5" (1.689 m)    GEN:  The patient appears stated age and is in NAD. HEENT:  Normocephalic, atraumatic.  The mucous membranes are moist. The superficial temporal arteries are without ropiness or tenderness. CV:  RRR Lungs:  CTAB Neck/HEME:  There are no carotid bruits bilaterally.  Neurological examination:  Orientation: The patient is alert and oriented x3. Cranial nerves: There is good facial symmetry with facial hypomimia. The speech is fluent and clear. Soft palate rises symmetrically and there is no tongue deviation. Hearing is intact to conversational tone. Sensation: Sensation is intact to light touch throughout Motor: Strength is at least antigravity x4.  Movement examination: Tone: There is mild increased tone in the RUE Abnormal movements: there is RUE rest tremor Coordination:  There is min decremation with RAM's Gait and Station: The patient has no difficulty arising out of a deep-seated chair without the use of the  hands. 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  I have reviewed and interpreted the following labs independently    Chemistry      Component Value Date/Time   NA 141 03/17/2020 0341   K 4.2 03/17/2020 0341   CL 106 03/17/2020 0341   CO2 28 03/17/2020 0341   BUN 16 03/17/2020 0341   CREATININE 0.62 03/17/2020 0341      Component Value Date/Time   CALCIUM 8.7 (L) 03/17/2020 0341       Lab Results  Component Value Date   WBC 13.2 (H) 03/17/2020   HGB  11.6 (L) 03/17/2020   HCT 37.2 03/17/2020   MCV 95.1 03/17/2020   PLT 221 03/17/2020    No results found for: TSH   Total time spent on today's visit was 30 minutes, including both face-to-face time and nonface-to-face time.  Time included that spent on review of records (prior notes available to me/labs/imaging if pertinent), discussing treatment and goals, answering patient's questions and coordinating care.  Cc:  Harlan Stains, MD

## 2020-09-08 ENCOUNTER — Encounter: Payer: Self-pay | Admitting: Neurology

## 2020-09-08 ENCOUNTER — Ambulatory Visit: Payer: Medicare PPO | Admitting: Neurology

## 2020-09-08 ENCOUNTER — Other Ambulatory Visit: Payer: Self-pay

## 2020-09-08 VITALS — BP 111/69 | HR 78 | Resp 18 | Ht 66.5 in | Wt 145.0 lb

## 2020-09-08 DIAGNOSIS — G20 Parkinson's disease: Secondary | ICD-10-CM

## 2020-09-08 MED ORDER — CARBIDOPA-LEVODOPA 25-100 MG PO TABS: ORAL_TABLET | ORAL | 1 refills | Status: DC

## 2020-09-08 MED ORDER — OPICAPONE 50 MG PO CAPS: 1.0000 | ORAL_CAPSULE | Freq: Every day | ORAL | 1 refills | Status: DC

## 2020-09-09 DIAGNOSIS — H25812 Combined forms of age-related cataract, left eye: Secondary | ICD-10-CM | POA: Diagnosis not present

## 2020-09-09 DIAGNOSIS — Z01818 Encounter for other preprocedural examination: Secondary | ICD-10-CM | POA: Diagnosis not present

## 2020-09-09 DIAGNOSIS — H2512 Age-related nuclear cataract, left eye: Secondary | ICD-10-CM | POA: Diagnosis not present

## 2020-09-09 DIAGNOSIS — H52223 Regular astigmatism, bilateral: Secondary | ICD-10-CM | POA: Diagnosis not present

## 2020-09-14 ENCOUNTER — Encounter: Payer: Self-pay | Admitting: Neurology

## 2020-09-14 NOTE — Progress Notes (Signed)
Katie Daniel (Key: [id number]) Ongentys 50MG  capsules   Form [employer] Electronic PA Form Created 21 hours ago Sent to Plan 21 hours ago Plan Response 21 hours ago Submit Clinical Questions 21 hours ago Determination Favorable 12 minutes ago Message from Plan PA Case: [id number], Status: Approved, Coverage Starts on: 12/05/2019 12:00:00 AM, Coverage Ends on: 12/03/2021 12:00:00 AM. Questions? Contact [phone].

## 2020-09-17 ENCOUNTER — Other Ambulatory Visit (HOSPITAL_COMMUNITY): Payer: Self-pay | Admitting: Family Medicine

## 2020-09-17 ENCOUNTER — Ambulatory Visit (HOSPITAL_COMMUNITY)
Admission: RE | Admit: 2020-09-17 | Discharge: 2020-09-17 | Disposition: A | Discharge status: Home / Self Care | Payer: Medicare PPO | Source: Ambulatory Visit | Attending: Family Medicine | Admitting: Family Medicine

## 2020-09-17 ENCOUNTER — Other Ambulatory Visit: Payer: Self-pay

## 2020-09-17 DIAGNOSIS — K5900 Constipation, unspecified: Secondary | ICD-10-CM | POA: Diagnosis not present

## 2020-09-17 DIAGNOSIS — M7989 Other specified soft tissue disorders: Secondary | ICD-10-CM | POA: Diagnosis not present

## 2020-09-20 ENCOUNTER — Other Ambulatory Visit: Payer: Self-pay | Admitting: Family Medicine

## 2020-09-20 DIAGNOSIS — M48061 Spinal stenosis, lumbar region without neurogenic claudication: Secondary | ICD-10-CM | POA: Diagnosis not present

## 2020-09-20 DIAGNOSIS — I82812 Embolism and thrombosis of superficial veins of left lower extremities: Secondary | ICD-10-CM

## 2020-09-23 DIAGNOSIS — H25811 Combined forms of age-related cataract, right eye: Secondary | ICD-10-CM | POA: Diagnosis not present

## 2020-09-23 DIAGNOSIS — H2511 Age-related nuclear cataract, right eye: Secondary | ICD-10-CM | POA: Diagnosis not present

## 2020-09-24 ENCOUNTER — Ambulatory Visit
Admission: RE | Admit: 2020-09-24 | Discharge: 2020-09-24 | Disposition: A | Discharge status: Home / Self Care | Payer: Medicare PPO | Source: Ambulatory Visit | Attending: Family Medicine | Admitting: Family Medicine

## 2020-09-24 DIAGNOSIS — I82812 Embolism and thrombosis of superficial veins of left lower extremities: Secondary | ICD-10-CM | POA: Diagnosis not present

## 2020-09-27 DIAGNOSIS — M48061 Spinal stenosis, lumbar region without neurogenic claudication: Secondary | ICD-10-CM | POA: Diagnosis not present

## 2020-09-29 DIAGNOSIS — M48061 Spinal stenosis, lumbar region without neurogenic claudication: Secondary | ICD-10-CM | POA: Diagnosis not present

## 2020-10-01 ENCOUNTER — Other Ambulatory Visit: Payer: Self-pay

## 2020-10-06 DIAGNOSIS — M48061 Spinal stenosis, lumbar region without neurogenic claudication: Secondary | ICD-10-CM | POA: Diagnosis not present

## 2020-10-08 DIAGNOSIS — M48061 Spinal stenosis, lumbar region without neurogenic claudication: Secondary | ICD-10-CM | POA: Diagnosis not present

## 2020-10-13 DIAGNOSIS — M48061 Spinal stenosis, lumbar region without neurogenic claudication: Secondary | ICD-10-CM | POA: Diagnosis not present

## 2020-10-14 ENCOUNTER — Other Ambulatory Visit: Payer: Self-pay

## 2020-10-14 MED ORDER — OPICAPONE 50 MG PO CAPS
1.0000 | ORAL_CAPSULE | Freq: Every day | ORAL | 1 refills | Status: DC
Start: 2020-10-14 — End: 2021-02-02

## 2020-10-15 DIAGNOSIS — M48061 Spinal stenosis, lumbar region without neurogenic claudication: Secondary | ICD-10-CM | POA: Diagnosis not present

## 2020-11-11 ENCOUNTER — Ambulatory Visit: Payer: Medicare PPO | Admitting: Neurology

## 2020-12-15 ENCOUNTER — Ambulatory Visit
Admission: RE | Admit: 2020-12-15 | Discharge: 2020-12-15 | Disposition: A | Discharge status: Home / Self Care | Payer: Medicare PPO | Source: Ambulatory Visit | Attending: Family Medicine | Admitting: Family Medicine

## 2020-12-15 ENCOUNTER — Other Ambulatory Visit: Payer: Self-pay | Admitting: Family Medicine

## 2020-12-15 DIAGNOSIS — I872 Venous insufficiency (chronic) (peripheral): Secondary | ICD-10-CM | POA: Diagnosis not present

## 2020-12-15 DIAGNOSIS — Z23 Encounter for immunization: Secondary | ICD-10-CM | POA: Diagnosis not present

## 2020-12-15 DIAGNOSIS — I82812 Embolism and thrombosis of superficial veins of left lower extremities: Secondary | ICD-10-CM | POA: Diagnosis not present

## 2020-12-15 DIAGNOSIS — M7989 Other specified soft tissue disorders: Secondary | ICD-10-CM | POA: Diagnosis not present

## 2020-12-29 ENCOUNTER — Other Ambulatory Visit: Payer: Self-pay

## 2020-12-29 ENCOUNTER — Telehealth: Payer: Self-pay | Admitting: Neurology

## 2020-12-29 MED ORDER — ROPINIROLE HCL ER 8 MG PO TB24
8.0000 mg | ORAL_TABLET | Freq: Every day | ORAL | 1 refills | Status: DC
Start: 2020-12-29 — End: 2020-12-30

## 2020-12-29 NOTE — Telephone Encounter (Signed)
Rx(s) sent to pharmacy electronically.  

## 2020-12-30 MED ORDER — ROPINIROLE HCL ER 8 MG PO TB24
8.0000 mg | ORAL_TABLET | Freq: Every day | ORAL | 0 refills | Status: DC
Start: 2020-12-30 — End: 2021-02-23

## 2020-12-30 NOTE — Telephone Encounter (Signed)
Rx(s) sent to pharmacy electronically.  

## 2021-01-07 DIAGNOSIS — Z96651 Presence of right artificial knee joint: Secondary | ICD-10-CM | POA: Diagnosis not present

## 2021-01-07 DIAGNOSIS — M25561 Pain in right knee: Secondary | ICD-10-CM | POA: Diagnosis not present

## 2021-01-17 ENCOUNTER — Other Ambulatory Visit: Payer: Self-pay | Admitting: *Deleted

## 2021-01-17 DIAGNOSIS — M79604 Pain in right leg: Secondary | ICD-10-CM

## 2021-01-18 DIAGNOSIS — R2689 Other abnormalities of gait and mobility: Secondary | ICD-10-CM | POA: Diagnosis not present

## 2021-01-18 DIAGNOSIS — Z96651 Presence of right artificial knee joint: Secondary | ICD-10-CM | POA: Diagnosis not present

## 2021-01-19 ENCOUNTER — Telehealth: Payer: Self-pay | Admitting: Neurology

## 2021-01-19 MED ORDER — PRIMIDONE 50 MG PO TABS
50.0000 mg | ORAL_TABLET | Freq: Two times a day (BID) | ORAL | 0 refills | Status: DC
Start: 2021-01-19 — End: 2021-02-23

## 2021-01-19 NOTE — Telephone Encounter (Signed)
Rx(s) sent to pharmacy electronically.  

## 2021-01-20 DIAGNOSIS — R2689 Other abnormalities of gait and mobility: Secondary | ICD-10-CM | POA: Diagnosis not present

## 2021-01-20 DIAGNOSIS — Z96651 Presence of right artificial knee joint: Secondary | ICD-10-CM | POA: Diagnosis not present

## 2021-01-25 DIAGNOSIS — E559 Vitamin D deficiency, unspecified: Secondary | ICD-10-CM | POA: Diagnosis not present

## 2021-01-25 DIAGNOSIS — Z0000 Encounter for general adult medical examination without abnormal findings: Secondary | ICD-10-CM | POA: Diagnosis not present

## 2021-01-25 DIAGNOSIS — Z96651 Presence of right artificial knee joint: Secondary | ICD-10-CM | POA: Diagnosis not present

## 2021-01-25 DIAGNOSIS — I872 Venous insufficiency (chronic) (peripheral): Secondary | ICD-10-CM | POA: Diagnosis not present

## 2021-01-25 DIAGNOSIS — E785 Hyperlipidemia, unspecified: Secondary | ICD-10-CM | POA: Diagnosis not present

## 2021-01-25 DIAGNOSIS — R2689 Other abnormalities of gait and mobility: Secondary | ICD-10-CM | POA: Diagnosis not present

## 2021-01-25 DIAGNOSIS — G20 Parkinson's disease: Secondary | ICD-10-CM | POA: Diagnosis not present

## 2021-01-25 DIAGNOSIS — L989 Disorder of the skin and subcutaneous tissue, unspecified: Secondary | ICD-10-CM | POA: Diagnosis not present

## 2021-01-27 DIAGNOSIS — Z96651 Presence of right artificial knee joint: Secondary | ICD-10-CM | POA: Diagnosis not present

## 2021-01-27 DIAGNOSIS — R2689 Other abnormalities of gait and mobility: Secondary | ICD-10-CM | POA: Diagnosis not present

## 2021-02-01 DIAGNOSIS — Z96651 Presence of right artificial knee joint: Secondary | ICD-10-CM | POA: Diagnosis not present

## 2021-02-01 DIAGNOSIS — R2689 Other abnormalities of gait and mobility: Secondary | ICD-10-CM | POA: Diagnosis not present

## 2021-02-02 ENCOUNTER — Other Ambulatory Visit: Payer: Self-pay

## 2021-02-02 ENCOUNTER — Ambulatory Visit (HOSPITAL_COMMUNITY)
Admission: RE | Admit: 2021-02-02 | Discharge: 2021-02-02 | Disposition: A | Discharge status: Home / Self Care | Payer: Medicare PPO | Source: Ambulatory Visit | Attending: Vascular Surgery | Admitting: Vascular Surgery

## 2021-02-02 ENCOUNTER — Ambulatory Visit: Payer: Medicare PPO | Admitting: Physician Assistant

## 2021-02-02 VITALS — BP 96/62 | HR 68 | Temp 98.2°F | Resp 20 | Ht 66.5 in | Wt 145.9 lb

## 2021-02-02 DIAGNOSIS — M79604 Pain in right leg: Secondary | ICD-10-CM | POA: Insufficient documentation

## 2021-02-02 DIAGNOSIS — I8393 Asymptomatic varicose veins of bilateral lower extremities: Secondary | ICD-10-CM

## 2021-02-02 DIAGNOSIS — I872 Venous insufficiency (chronic) (peripheral): Secondary | ICD-10-CM

## 2021-02-02 DIAGNOSIS — M79605 Pain in left leg: Secondary | ICD-10-CM | POA: Diagnosis not present

## 2021-02-02 NOTE — Progress Notes (Signed)
[facility] [place]   Reason for referral: Swollen left  leg  History of Present Illness  Katie Daniel is a 75 y.o. female who presents with chief complaint: swollen leg.  Patient notes, onset of swelling left LE >24  months ago, associated with prolonged sitting, travel.  The patient has had a history of DVT, positive history of varicose vein, no history of venous stasis ulcers, no history of  Lymphedema and no history of skin changes in lower legs.  There is no family history of venous disorders.  The patient has  used compression stockings in the past.  Her fisrt superficial DVT in the GSV was documented in 2014.  She was placed on Coumadin for flight travel as a precaution.  She recently drove to [place] and developed significant edema in the popliteal and calf area of the left leg to the ankle.  She has also gone through episodes of cellulitis.  She stays pretty active and has slowed down recently s/p right TKA.  Currently she is in PT.    She denise DM, HTN, Afib and CAD.    Past Medical History:  Diagnosis Date  . Arthritis   . Essential tremor   . Parkinson disease ([place])   . Squamous cell skin cancer    Scalp and side of nose    Past Surgical History:  Procedure Laterality Date  . COLONOSCOPY    . KNEE ARTHROSCOPY WITH MENISCAL REPAIR Right   . MOUTH SURGERY    . 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   Procedure: TOTAL KNEE ARTHROPLASTY;  Surgeon: Paralee Cancel, MD;  Location: [facility];  Service: Orthopedics;  Laterality: Right;  70 mins    Social History   Socioeconomic History  . Marital status: Widowed    Spouse name: Not on file  . Number of children: 2  . Years of education: 51  . Highest education level: Not on file  Occupational History  . Occupation: retired  Tobacco Use  . Smoking status: Never Smoker  . Smokeless tobacco: Never Used  Vaping Use  . Vaping Use: Never used  Substance and Sexual Activity  . Alcohol use: Yes     Comment: glass of wine occ - 1 time per week  . Drug use: Never  . Sexual activity: Not on file  Other Topics Concern  . Not on file  Social History Narrative   Drinks caffeine   Right handed   One story home   Social Determinants of Health   Financial Resource Strain: Not on file  Food Insecurity: Not on file  Transportation Needs: Not on file  Physical Activity: Not on file  Stress: Not on file  Social Connections: Not on file  Intimate Partner Violence: Not on file    Family History  Problem Relation Age of Onset  . Heart attack Mother   . Stroke Father   . Breast cancer Child     Current Outpatient Medications on File Prior to Visit  Medication Sig Dispense Refill  . calcium carbonate (OS-CAL) 1250 (500 Ca) MG chewable tablet Chew 2 tablets by mouth daily.    . carbidopa-levodopa (SINEMET IR) 25-100 MG tablet 1.5 tablet at 7 AM/ 1 tablet at 11 AM/3 PM/7 PM 405 tablet 1  . cholecalciferol (VITAMIN D3) 25 MCG (1000 UNIT) tablet Take 4,000 Units by mouth daily.    . meloxicam (MOBIC) 15 MG tablet meloxicam 15 mg tablet  TAKE 1 TABLET BY MOUTH DAILY WITH FOOD  AS NEEDED FOR PAIN    . Opicapone (ONGENTYS) 50 MG CAPS     . polyethylene glycol powder (GLYCOLAX/MIRALAX) 17 GM/SCOOP powder     . primidone (MYSOLINE) 50 MG tablet Take 1 tablet (50 mg total) by mouth 2 (two) times daily. 60 tablet 0  . rOPINIRole (REQUIP XL) 8 MG 24 hr tablet Take 1 tablet (8 mg total) by mouth daily. 90 tablet 0   No current facility-administered medications on file prior to visit.    Allergies as of 02/02/2021 - Review Complete 02/02/2021  Allergen Reaction Noted  . Amoxicillin-pot clavulanate Other (See Comments) 01/25/2021  . Latex Other (See Comments) 01/25/2021  . Penicillins Hives 03/02/2020     ROS:   General:  No weight loss, Fever, chills  HEENT: No recent headaches, no nasal bleeding, no visual changes, no sore throat  Neurologic: No dizziness, blackouts, seizures. No  recent symptoms of stroke or mini- stroke. No recent episodes of slurred speech, or temporary blindness.  Cardiac: No recent episodes of chest pain/pressure, no shortness of breath at rest.  No shortness of breath with exertion.  Denies history of atrial fibrillation or irregular heartbeat  Vascular: No history of rest pain in feet.  No history of claudication.  No history of non-healing ulcer, + history of DVT   Pulmonary: No home oxygen, no productive cough, no hemoptysis,  No asthma or wheezing  Musculoskeletal:  [x ] Arthritis, [ ]  Low back pain,  [x ] Joint pain  Hematologic:No history of hypercoagulable state.  No history of easy bleeding.  No history of anemia  Gastrointestinal: No hematochezia or melena,  No gastroesophageal reflux, no trouble swallowing  Urinary: [ ]  chronic Kidney disease, [ ]  on HD - [ ]  MWF or [ ]  TTHS, [ ]  Burning with urination, [ ]  Frequent urination, [ ]  Difficulty urinating;   Skin: No rashes  Psychological: No history of anxiety,  No history of depression  Physical Examination  Vitals:   02/02/21 1136  BP: 96/62  Pulse: 68  Resp: 20  Temp: 98.2 F (36.8 C)  TempSrc: Temporal  SpO2: 97%  Weight: 145 lb 14.4 oz (66.2 kg)  Height: 5' 6.5" (1.689 m)    Body mass index is 23.2 kg/m.  General:  Alert and oriented, no acute distress HEENT: Normal Neck: No bruit or JVD Pulmonary: Clear to auscultation bilaterally Cardiac: Regular Rate and Rhythm without murmur Abdomen: Soft, non-tender, non-distended, no mass, no scars Skin: No rash Extremity Pulses:  2+ radial, brachial, femoral, dorsalis pedis,  pulses bilaterally Musculoskeletal: No deformity or edema  Neurologic: Upper and lower extremity motor 5/5 and symmetric  DATA: +------------------+---------+------+-----------+------------+-------------  ----+  LEFT       Reflux NoRefluxReflux TimeDiameter cmsComments                      Yes                         +------------------+---------+------+-----------+------------+-------------  ----+  CFV        no                              +------------------+---------+------+-----------+------------+-------------  ----+  FV mid      no                              +------------------+---------+------+-----------+------------+-------------  ----+  Popliteal     no                              +------------------+---------+------+-----------+------------+-------------  ----+  GSV at [facility]          yes  >500 ms   0.83             +------------------+---------+------+-----------+------------+-------------  ----+  GSV prox thigh        yes  >500 ms   0.51             +------------------+---------+------+-----------+------------+-------------  ----+  GSV mid thigh         yes  >500 ms   0.50             +------------------+---------+------+-----------+------------+-------------  ----+  GSV dist thigh        yes  >500 ms   0.45  chronic  thrombus   +------------------+---------+------+-----------+------------+-------------  ----+  GSV at knee          yes  >500 ms   0.46             +------------------+---------+------+-----------+------------+-------------  ----+  GSV prox calf         yes  >500 ms   0.46  chronic  thrombus   +------------------+---------+------+-----------+------------+-------------  ----+  SSV Pop Fossa   no               0.25             +------------------+---------+------+-----------+------------+-------------  ----+  anterior accessory      yes  >500 ms   0.53  large  tortuous                                  branch  courses                                 lateral        +------------------+---------+------+-----------+------------+-------------  ----+  calf varicosities                     chronic  thrombus   +------------------+---------+------+-----------+------------+-------------  ----+    Summary:  Left:  - No evidence of deep vein thrombosis from the common femoral through the  popliteal veins.  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  - The deep venous system is competent.  - The great saphenous vein is not competent.  - The anterior accessory vein is not competent.  - The small saphenous vein is competent.    Assessment: Post thrombotic syndrome causing venous reflux since 2014.   On venous reflux duplex she has incompetent GSV from the junction the SSV with large vein size > 0.4 cm.  No acute DVT findings.  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  She has visible varicose veins.  She has palpable pedal pulses and no history of wounds.     Plan: We will place her in thigh high compression 20-30 mm hg daily, exercise as tolerates, elevation when at rest, and avoid prolonged sitting and standing.  She will f/u in 3 months for evaluation and consideration of laser ablation therapy.  Roxy Horseman PA-C [facility] [place] Office: [phone]  MD in clinic [place]

## 2021-02-03 DIAGNOSIS — R2689 Other abnormalities of gait and mobility: Secondary | ICD-10-CM | POA: Diagnosis not present

## 2021-02-03 DIAGNOSIS — Z8249 Family history of ischemic heart disease and other diseases of the circulatory system: Secondary | ICD-10-CM | POA: Diagnosis not present

## 2021-02-03 DIAGNOSIS — M79602 Pain in left arm: Secondary | ICD-10-CM | POA: Diagnosis not present

## 2021-02-03 DIAGNOSIS — Z96651 Presence of right artificial knee joint: Secondary | ICD-10-CM | POA: Diagnosis not present

## 2021-02-04 DIAGNOSIS — D045 Carcinoma in situ of skin of trunk: Secondary | ICD-10-CM | POA: Diagnosis not present

## 2021-02-04 DIAGNOSIS — D485 Neoplasm of uncertain behavior of skin: Secondary | ICD-10-CM | POA: Diagnosis not present

## 2021-02-04 DIAGNOSIS — L538 Other specified erythematous conditions: Secondary | ICD-10-CM | POA: Diagnosis not present

## 2021-02-04 DIAGNOSIS — R600 Localized edema: Secondary | ICD-10-CM | POA: Diagnosis not present

## 2021-02-04 DIAGNOSIS — L820 Inflamed seborrheic keratosis: Secondary | ICD-10-CM | POA: Diagnosis not present

## 2021-02-08 DIAGNOSIS — Z96651 Presence of right artificial knee joint: Secondary | ICD-10-CM | POA: Diagnosis not present

## 2021-02-08 DIAGNOSIS — R2689 Other abnormalities of gait and mobility: Secondary | ICD-10-CM | POA: Diagnosis not present

## 2021-02-10 DIAGNOSIS — R2689 Other abnormalities of gait and mobility: Secondary | ICD-10-CM | POA: Diagnosis not present

## 2021-02-10 DIAGNOSIS — Z96651 Presence of right artificial knee joint: Secondary | ICD-10-CM | POA: Diagnosis not present

## 2021-02-14 ENCOUNTER — Other Ambulatory Visit: Payer: Self-pay | Admitting: Neurology

## 2021-02-14 DIAGNOSIS — R2689 Other abnormalities of gait and mobility: Secondary | ICD-10-CM | POA: Diagnosis not present

## 2021-02-14 DIAGNOSIS — Z96651 Presence of right artificial knee joint: Secondary | ICD-10-CM | POA: Diagnosis not present

## 2021-02-16 DIAGNOSIS — J3089 Other allergic rhinitis: Secondary | ICD-10-CM | POA: Diagnosis not present

## 2021-02-16 DIAGNOSIS — Z20822 Contact with and (suspected) exposure to covid-19: Secondary | ICD-10-CM | POA: Diagnosis not present

## 2021-02-18 DIAGNOSIS — Z96651 Presence of right artificial knee joint: Secondary | ICD-10-CM | POA: Diagnosis not present

## 2021-02-21 NOTE — Progress Notes (Signed)
Assessment/Plan:   1. ET/PD             -Patient initially diagnosed with Parkinson's disease in 2014 by Dr. Maxine Glenn.  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 Nicki Reaper in 2018. 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  Discussed good prognosis with this             -continue carbidopa/levodopa 25/100, 1.5 tablets at 7 AM/1 tablet at 11 AM/1 tablet at 3 PM/1 tablet at 7 PM  -continue Opicapone, 50 mg at bedtime.             -Continue Requip XL, 8 mg daily.             -Continue primidone, 50 mg twice daily.  Not sure that this is helping, but she has a very significant component of essential tremor on the right particularly and would probably need DBS to fix this part.  -pt going to start personal trainer.  She is in PT for knee/leg issue now.   Subjective:   Katie Daniel was seen today in follow up for ET/Parkinsons disease.  My previous records were reviewed prior to todays visit as well as outside records available to me.  Last visit, the patient wanted to start opicapone.  She did not end up starting that until mid January.  She emailed me late January stating that she thought that the dosage was too strong and wanted to lower the dosage.  I told her that that was not really an option, as it really only came in the 50 mg dosage.  Her pharmacist ended up telling her it came in a 25 mg dose.  I told her that was true, but was really only for patients who had liver failure (it was her impression that it was for those who did not tolerate the higher dose).  She is taking the 50 mg dosage and doing well with it.  She has been attending physical therapy for her right knee and hip.  She starts with a Physiological scientist today.  Current prescribed movement disorder medications:  Carbidopa/levodopa 25/100, 1.5 tablet at 7 AM/ 1 tablet at 11 AM/3 PM/7 PM Requip XL, 8 mg daily  Primidone 50 mg,  1 po bid Opicapone, 50 mg at bedtime (started last  visit)  PREVIOUS MEDICATIONS:  amantadine (no help for tremor so d/c); carbidopa/levodopa 25/100; requip xl; primidone; entacapone - given but she didn't take it; opicapone   ALLERGIES:   Allergies  Allergen Reactions  . Amoxicillin-Pot Clavulanate Other (See Comments)  . Latex Other (See Comments)  . Penicillins Hives    Did it involve swelling of the face/tongue/throat, SOB, or low BP? No Did it involve sudden or severe rash/hives, skin peeling, or any reaction on the inside of your mouth or nose? Yes Did you need to seek medical attention at a hospital or doctor's office? Yes When did it last happen?5 years If all above answers are "NO", may proceed with cephalosporin use. Tolerated ancef 03/16/20    CURRENT MEDICATIONS:  Outpatient Encounter Medications as of 02/23/2021  Medication Sig  . azithromycin (ZITHROMAX) 250 MG tablet Take 1 tablet by mouth daily.  . calcium carbonate (OS-CAL) 1250 (500 Ca) MG chewable tablet Chew 2 tablets by mouth daily.  . cholecalciferol (VITAMIN D3) 25 MCG (1000 UNIT) tablet Take 4,000 Units by mouth daily.  . polyethylene glycol powder (GLYCOLAX/MIRALAX) 17 GM/SCOOP powder   . [  DISCONTINUED] carbidopa-levodopa (SINEMET IR) 25-100 MG tablet 1.5 tablet at 7 AM/ 1 tablet at 11 AM/3 PM/7 PM  . [DISCONTINUED] Opicapone (ONGENTYS) 50 MG CAPS Take 1 tablet by mouth daily.  . [DISCONTINUED] primidone (MYSOLINE) 50 MG tablet Take 1 tablet (50 mg total) by mouth 2 (two) times daily.  . [DISCONTINUED] rOPINIRole (REQUIP XL) 8 MG 24 hr tablet Take 1 tablet (8 mg total) by mouth daily.  . carbidopa-levodopa (SINEMET IR) 25-100 MG tablet 1.5 tablet at 7 AM/ 1 tablet at 11 AM/3 PM/7 PM  . Opicapone (ONGENTYS) 50 MG CAPS Take 1 tablet by mouth daily.  . primidone (MYSOLINE) 50 MG tablet Take 1 tablet (50 mg total) by mouth 2 (two) times daily.  Marland Kitchen rOPINIRole (REQUIP XL) 8 MG 24 hr tablet Take 1 tablet (8 mg total) by mouth daily.  . [DISCONTINUED] meloxicam  (MOBIC) 15 MG tablet meloxicam 15 mg tablet  TAKE 1 TABLET BY MOUTH DAILY WITH FOOD AS NEEDED FOR PAIN   No facility-administered encounter medications on file as of 02/23/2021.    Objective:   PHYSICAL EXAMINATION:    VITALS:   Vitals:   02/23/21 0928  BP: 124/68  Pulse: 73  SpO2: 99%  Weight: 146 lb (66.2 kg)  Height: 5' 5.5" (1.664 m)    GEN:  The patient appears stated age and is in NAD. HEENT:  Normocephalic, atraumatic.  The mucous membranes are moist.  Neurological examination:  Orientation: The patient is alert and oriented x3. Cranial nerves: There is good facial symmetry with facial hypomimia. The speech is fluent and clear. Soft palate rises symmetrically and there is no tongue deviation. Hearing is intact to conversational tone. Sensation: Sensation is intact to light touch throughout Motor: Strength is at least antigravity x4.  Movement examination: Tone: There is mild increased tone in the RUE Abnormal movements: RUE tremor is felt but not seen Coordination:  There is no decremation, with any form of RAMS, including alternating supination and pronation of the forearm, hand opening and closing, finger taps, heel taps and toe taps. Gait and Station: The patient has no difficulty arising out of a deep-seated chair without the use of the hands. 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  Gait is just slightly antalgic  I have reviewed and interpreted the following labs independently    Chemistry      Component Value Date/Time   NA 141 03/17/2020 0341   K 4.2 03/17/2020 0341   CL 106 03/17/2020 0341   CO2 28 03/17/2020 0341   BUN 16 03/17/2020 0341   CREATININE 0.62 03/17/2020 0341      Component Value Date/Time   CALCIUM 8.7 (L) 03/17/2020 0341       Lab Results  Component Value Date   WBC 13.2 (H) 03/17/2020   HGB 11.6 (L) 03/17/2020   HCT 37.2 03/17/2020   MCV 95.1 03/17/2020   PLT 221 03/17/2020    No  results found for: TSH   Total time spent on today's visit was 24 minutes, including both face-to-face time and nonface-to-face time.  Time included that spent on review of records (prior notes available to me/labs/imaging if pertinent), discussing treatment and goals, answering patient's questions and coordinating care.  Cc:  Harlan Stains, MD

## 2021-02-23 ENCOUNTER — Ambulatory Visit: Payer: Medicare PPO | Admitting: Neurology

## 2021-02-23 ENCOUNTER — Other Ambulatory Visit: Payer: Self-pay

## 2021-02-23 ENCOUNTER — Encounter: Payer: Self-pay | Admitting: Neurology

## 2021-02-23 VITALS — BP 124/68 | HR 73 | Ht 65.5 in | Wt 146.0 lb

## 2021-02-23 DIAGNOSIS — G20 Parkinson's disease: Secondary | ICD-10-CM | POA: Diagnosis not present

## 2021-02-23 MED ORDER — ROPINIROLE HCL ER 8 MG PO TB24
8.0000 mg | ORAL_TABLET | Freq: Every day | ORAL | 1 refills | Status: DC
Start: 2021-02-23 — End: 2021-10-26

## 2021-02-23 MED ORDER — ONGENTYS 50 MG PO CAPS
1.0000 | ORAL_CAPSULE | Freq: Every day | ORAL | 1 refills | Status: DC
Start: 2021-02-23 — End: 2021-09-01

## 2021-02-23 MED ORDER — CARBIDOPA-LEVODOPA 25-100 MG PO TABS
ORAL_TABLET | ORAL | 1 refills | Status: DC
Start: 2021-02-23 — End: 2021-09-01

## 2021-02-23 MED ORDER — PRIMIDONE 50 MG PO TABS
50.0000 mg | ORAL_TABLET | Freq: Two times a day (BID) | ORAL | 1 refills | Status: DC
Start: 2021-02-23 — End: 2021-09-01

## 2021-02-23 NOTE — Patient Instructions (Signed)
Online Resources for Power over Parkinson's Group March 2022  . Local New Home Online Groups  o Power over [employer] Group :   - Power Over Parkinson's Patient Education Group will be Wednesday, March 9th at 2pm via Zoom.   - Upcoming Power over Parkinson's Meetings:  2nd Wednesdays of the month at 2 pm:       April 13th, May 11th - Contact Amy Marriott at [email or web address] if interested in participating in this online group o Parkinson's Care Partners Group:    3rd Mondays, Contact Corwin Levins o Atypical Parkinsonian Patient Group:   4th Wednesdays, Contact Corwin Levins o If you are interested in participating in these online groups with Judson Roch, please contact her directly for how to join those meetings.  Her contact information is [email or web address].  She will send you a link to join the [employer].  (Please note that Corwin Levins , MSW, LCSW, has resigned her position at [employer] [facility], but will continue to lead the online groups temporarily)  . [place]:  [email or web address] o PD Health at Home continues:  Mindfulness Mondays, Expert Briefing Tuesdays, Wellness Wednesdays, Take Time Thursdays, Fitness Fridays -Listings for March 2022 are on the website o Upcoming Webinar:  Conversations about Complementary Therapies and PD.  Wednesday, March 2nd @ 1 pm o Upcoming Webinar:  Can We Put the Brakes on PD Progression?  Wednesday, April 6th @ 1 pm o Geneticist, molecular) at [email or web address] o  Please check out their website to sign up for emails and see their full online offerings  . [place]:  [email or web address]  o Upcoming Webinar:   Trouble Sleeping?  What to Know about Acting out Dreams and other Sleep Issues.  Thursday, March 17th @ 12 noon o Check out additional information on their website to see their full online offerings  . [place]:  [email or web address] o Upcoming  Webinar:  Women and Parkinson's.  Tuesday, March 8th at 2 pm o Care Partner Monthly Meetup.  With 10-28-1994 [place].  First Tuesday of each month, 2 pm o Check out additional information to Live Well Today on their website  . Parkinson and Movement Disorders (PMD) Alliance:  www.pmdalliance.org o [employer] Online:  Online Education Events o Sign up for emails, which are sent weekly to give you updates on programming and online offerings   . Parkinson's Association of the [place]:  www.[email or web address] o Information on online support groups, education events, and online exercises including Yoga, Parkinson's exercises and more-LOTS of information on links to PD resources and online events o Virtual Support Group through Parkinson's Association of the [place]; next one is scheduled for Wednesday, March 09, 2021 at 2 pm. (These are typically scheduled for the 1st Wednesday of the month at 2 pm).  Visit website for details.  . Additional links for movement activities: o PWR! Moves Classes at [place] RESUMED!  Wednesdays 10 and 11 am.  Contact Amy Marriott, PT [email or web address] or [phone] if interested o Here is a link to the PWR!Moves classes on Zoom from [phone] - Daily Mon-Sat at 10:00. Via Zoom, FREE and open to all.  There is also a link below via Facebook if you use that platform. - [place] - [email or web address]://[email or web address] o Parkinson's Wellness Recovery (PWR! Moves)  www.[email or web address] - Info on the PWR! Virtual Experience:  You will have access to our expertise through self-assessment, guided plans that start with the PD-specific fundamentals, educational content, tips, Q&A with an expert,  and a Armed forces operational officer of PD-specific  pre-recorded and live exercise classes of varying types and intensity - both physical and cognitive! If that is not enough, we offer 1:1 wellness consultations (in-person or virtual) to personalize your PWR! Research scientist (medical).  - Check out the PWR! Move of the month on the [place] [facility] website:  [email or web address] o [employer] Fridays:  - As part of the PD Health @ Home program, this free video series focuses each week on one aspect of fitness designed to support people living with Parkinson's.  These weekly videos highlight the [place] recent fitness guidelines for people with Parkinson's disease. -  HollywoodSale.dk o Dance for PD website is offering free, live-stream classes throughout the week, as well as links to [employer] of classes:  [email or web address].[email or web address] o Dance for Parkinson's Class:  [place].  Free offering for people with Parkinson's and care partners; virtual class.  o For more information, contact [phone] or email Ruffin Frederick at [email or web address] o Virtual dance and Pilates for Parkinson's classes: Click on the Community Tab> Parkinson's Movement Initiative Tab.  To register for classes and for more information, visit www.[email or web address].co.[email or web address] and click the "community" tab.     o [employer] Parkinson's Cycling Classes  - [facility]: 1pm on Fridays-Live classes at [facility] [employer] at [email or web address] or [phone]) [facility] [employer]: Virtual Classes Mondays and Thursdays (contact [place] at [email or web address] or ([phone])   o [place] levels of classes are offered Tuesdays and Thursdays:  10:30 am,  12 noon & 1:45 pm at [facility].  - Active Stretching with Paula Compton Class starting in  March, on Fridays - To observe a class or for  more information, call [phone] or email [email or web address] . [employer]: o Chief Technology Officer Opportunities:  www.well-springsolutions.org/caregiver-education/caregiver-support-group.  You may also contact Vickki Muff at [email or web address] or [phone].   o Virtual Caregiver Retreat, Monday, February 21st, 3-5 pm o Powerful Tools for Caregivers, 6-wk series for caregivers, in-person, Thursdays March 10, 17, 24, 31 and April 7, 14, from 10:30-12:30 at 10-20-1973, [place].  Contact [address] (see above) to register o Well-Spring Navigator:  Just1Navigator program, a free service to help individuals and families through the journey of determining care for older adults.  The "Navigator" is a Vickki Muff, Education officer, museum, who will speak with a prospective client and/or loved ones to provide an assessment of the situation and a set of recommendations for a personalized care plan -- all free of charge, and whether [facility] [employer] offers the needed service or not. If the need is not a service we provide, we are well-connected with reputable programs in town that we can refer you to.  www.well-springsolutions.org or to speak with the Navigator, call [phone].

## 2021-02-25 DIAGNOSIS — Z96651 Presence of right artificial knee joint: Secondary | ICD-10-CM | POA: Diagnosis not present

## 2021-02-25 DIAGNOSIS — R2689 Other abnormalities of gait and mobility: Secondary | ICD-10-CM | POA: Diagnosis not present

## 2021-03-07 ENCOUNTER — Encounter: Payer: Self-pay | Admitting: Cardiology

## 2021-03-07 ENCOUNTER — Other Ambulatory Visit: Payer: Self-pay

## 2021-03-07 ENCOUNTER — Ambulatory Visit: Payer: Medicare PPO | Admitting: Cardiology

## 2021-03-07 VITALS — BP 100/64 | HR 74 | Ht 65.5 in | Wt 149.4 lb

## 2021-03-07 DIAGNOSIS — Z8249 Family history of ischemic heart disease and other diseases of the circulatory system: Secondary | ICD-10-CM | POA: Diagnosis not present

## 2021-03-07 NOTE — Addendum Note (Signed)
Addended by: Antonieta Iba on: 03/07/2021 10:42 AM   Modules accepted: Orders

## 2021-03-07 NOTE — Patient Instructions (Signed)
Medication Instructions:  Your physician recommends that you continue on your current medications as directed. Please refer to the Current Medication list given to you today.  *If you need a refill on your cardiac medications before your next appointment, please call your pharmacy*  Testing/Procedures: Your provider has recommended that you have a calcium score CT scan.   Follow-Up: At [facility] you and your health needs are our priority.  As part of our continuing mission to provide you with exceptional heart care, we have created designated Provider Care Teams.  These Care Teams include your primary Cardiologist (physician) and Advanced Practice Providers (APPs -  Physician Assistants and Nurse Practitioners) who all work together to provide you with the care you need, when you need it.  Your next appointment:   1 year(s)  The format for your next appointment:   In Person  Provider:   You may see Fransico Him, MD or one of the following Advanced Practice Providers on your designated Care Team:    Melina Copa, PA-C  Ermalinda Barrios, PA-C

## 2021-03-07 NOTE — Progress Notes (Addendum)
Cardiology Consult  Note    Date:  03/07/2021   ID:  Katie Daniel, DOB 11/29/1946, MRN [id number]  PCP:  Harlan Stains, MD  Cardiologist:  Fransico Him, MD   Chief Complaint  Patient presents with  . New Patient (Initial Visit)    Family  history of premature CAD    History of Present Illness:  Katie Daniel is a 75 y.o. female who is being seen today for the evaluation of family hx of premature CAD at the request of Harlan Stains, MD.  This is a 75yo female with a hx of Parkinsons disease and DVT who is referred for cardiac evaluation due to a strong fm hx of premature CAD. Both her parents had MI's and died in their 41's.  She became concerned about her heart hx when she developed left forearm pain one evening without any other symptoms.  The next day her arm pain was gone but she felt very fatigued while up fixing breakfast and had to sit down  the next day.  She denies any chest pain or pressure, SOB, DOE, PND, orthopnea, dizziness, palpitations or syncope. She has superficial thrombophlebitis in her left leg with chronic intermittent LE edema.  She is compliant with her meds and is tolerating meds with no SE.    Past Medical History:  Diagnosis Date  . Arthritis   . DVT (deep venous thrombosis) ([place])   . Essential tremor   . Fam hx-ischem heart disease   . Fatigue   . Hematoma    OF LEFT CALF  . Left arm pain   . Osteoarthritis   . Osteopenia   . Parkinson disease ([place])   . Squamous cell skin cancer    Scalp and side of nose  . Vitamin D deficiency     Past Surgical History:  Procedure Laterality Date  . COLONOSCOPY    . KNEE ARTHROSCOPY WITH MENISCAL REPAIR Right   . MOUTH SURGERY    . 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 carbidopa-levodopa (SINEMET IR)  25-100 MG tablet 1.5 tablet at 7 AM/ 1 tablet at 11 AM/3 PM/7 PM  . Opicapone (ONGENTYS) 50 MG CAPS Take 1 tablet by mouth daily.  Marland Kitchen OVER THE COUNTER MEDICATION Advil with  Acetaminophen - 2-4 tablets daily for hip pain  . polyethylene glycol powder (GLYCOLAX/MIRALAX) 17 GM/SCOOP powder   . primidone (MYSOLINE) 50 MG tablet Take 1 tablet (50 mg total) by mouth 2 (two) times daily.  Marland Kitchen rOPINIRole (REQUIP XL) 8 MG 24 hr tablet Take 1 tablet (8 mg total) by mouth daily.    Allergies:   Amoxicillin-pot clavulanate, Latex, and Penicillins   Social History   Socioeconomic History  . Marital status: Widowed    Spouse name: Not on file  . Number of children: 2  . Years of education: 47  . Highest education level: Not on file  Occupational History  . Occupation: retired  Tobacco Use  . Smoking status: Never Smoker  . Smokeless tobacco: Never Used  Vaping Use  . Vaping Use: Never used  Substance and Sexual Activity  . Alcohol use: Yes    Comment: glass of wine occ - 1 time per week  . Drug use: Never  . Sexual activity: Not on file  Other Topics  Concern  . Not on file  Social History Narrative   Drinks caffeine   Right handed   One story home   Social Determinants of Health   Financial Resource Strain: Not on file  Food Insecurity: Not on file  Transportation Needs: Not on file  Physical Activity: Not on file  Stress: Not on file  Social Connections: Not on file     Family History:  The patient's family history includes Breast cancer in her child; CAD in her paternal uncle; Heart attack in her father and mother; Stroke in her father.   ROS:   Please see the history of present illness.    ROS All other systems reviewed and are negative.  No flowsheet data found.     PHYSICAL EXAM:   VS:  BP 100/64   Pulse 74   Ht 5' 5.5" (1.664 m)   Wt 149 lb 6.4 oz (67.8 kg)   BMI 24.48 kg/m    GEN: Well nourished, well developed, in no acute distress  HEENT: normal  Neck: no  JVD, carotid bruits, or masses Cardiac: RRR; no murmurs, rubs, or gallops,no edema.  Intact distal pulses bilaterally.  Respiratory:  clear to auscultation bilaterally, normal work of breathing GI: soft, nontender, nondistended, + BS MS: no deformity or atrophy  Skin: warm and dry, no rash Neuro:  Alert and Oriented x 3, Strength and sensation are intact Psych: euthymic mood, full affect  Wt Readings from Last 3 Encounters:  03/07/21 149 lb 6.4 oz (67.8 kg)  02/23/21 146 lb (66.2 kg)  02/02/21 145 lb 14.4 oz (66.2 kg)      Studies/Labs Reviewed:   EKG:  EKG is ordered today.  The ekg ordered today demonstrates normal sinus rhythm at 74bpm with no ST changes  Recent Labs: 03/17/2020: BUN 16; Creatinine, Ser 0.62; Hemoglobin 11.6; Platelets 221; Potassium 4.2; Sodium 141   Lipid Panel No results found for: CHOL, TRIG, HDL, CHOLHDL, VLDL, LDLCALC, LDLDIRECT   Additional studies/ records that were reviewed today include:  OV notes from PCP    ASSESSMENT:    1. Family history of premature coronary artery disease      PLAN:  In order of problems listed above:  1.  Family hx of premature CAD -she has no anginal symptoms -her only CRF is her fm hs and advanced age but has a very strong family hx of premature CAD in 58's (bother her parents smoked) -nuclear stress test in 2009 showed no ischemia -EKG is normal -last LDL was 108 -I will get a coronary Ca score to assess future cardiac risk   Medication Adjustments/Labs and Tests Ordered: Current medicines are reviewed at length with the patient today.  Concerns regarding medicines are outlined above.  Medication changes, Labs and Tests ordered today are listed in the Patient Instructions below.  There are no Patient Instructions on file for this visit.   Signed, Fransico Him, MD  03/07/2021 10:32 AM    [facility] [address], [place], [place]  [address] Phone: [phone]; Fax: [phone]

## 2021-03-09 DIAGNOSIS — Z96651 Presence of right artificial knee joint: Secondary | ICD-10-CM | POA: Diagnosis not present

## 2021-03-09 DIAGNOSIS — R2689 Other abnormalities of gait and mobility: Secondary | ICD-10-CM | POA: Diagnosis not present

## 2021-03-15 DIAGNOSIS — Z96651 Presence of right artificial knee joint: Secondary | ICD-10-CM | POA: Diagnosis not present

## 2021-03-15 DIAGNOSIS — R2689 Other abnormalities of gait and mobility: Secondary | ICD-10-CM | POA: Diagnosis not present

## 2021-04-11 ENCOUNTER — Telehealth: Payer: Self-pay | Admitting: Cardiology

## 2021-04-11 NOTE — Telephone Encounter (Signed)
Spoke with the patient who wanted to make sure that there was not anything she needed to do to prepare for her calcium score CT scan tomorrow. Advised her that there were no restrictions or preparations needed prior to the scan. Patient verbalized understanding.

## 2021-04-11 NOTE — Telephone Encounter (Signed)
Patient called to see if there was anything special she needs to do for her appt on Wed 5/11. Please advise

## 2021-04-12 ENCOUNTER — Encounter: Payer: Self-pay | Admitting: Cardiology

## 2021-04-12 ENCOUNTER — Ambulatory Visit (INDEPENDENT_AMBULATORY_CARE_PROVIDER_SITE_OTHER)
Admission: RE | Admit: 2021-04-12 | Discharge: 2021-04-12 | Disposition: A | Discharge status: Home / Self Care | Payer: Self-pay | Source: Ambulatory Visit | Attending: Cardiology | Admitting: Cardiology

## 2021-04-12 ENCOUNTER — Other Ambulatory Visit: Payer: Self-pay

## 2021-04-12 DIAGNOSIS — Z8249 Family history of ischemic heart disease and other diseases of the circulatory system: Secondary | ICD-10-CM

## 2021-04-12 DIAGNOSIS — R931 Abnormal findings on diagnostic imaging of heart and coronary circulation: Secondary | ICD-10-CM | POA: Insufficient documentation

## 2021-04-12 DIAGNOSIS — I700 Atherosclerosis of aorta: Secondary | ICD-10-CM | POA: Insufficient documentation

## 2021-04-13 ENCOUNTER — Telehealth: Payer: Self-pay

## 2021-04-13 DIAGNOSIS — I700 Atherosclerosis of aorta: Secondary | ICD-10-CM

## 2021-04-13 NOTE — Telephone Encounter (Signed)
-----   Message from Sueanne Margarita, MD sent at 04/12/2021  4:52 PM EDT ----- Coronary Ca score is elevated at 34.4 in the LAD.  Also noted to have aortic atherosclerosis.  LDL goal < 70.  Please have her come in for FLP and ALT

## 2021-04-13 NOTE — Telephone Encounter (Signed)
The patient has been notified of the result and verbalized understanding.  All questions (if any) were answered. Katie Iba, RN 04/13/2021 11:50 AM  Patient will come in next week for lab work.

## 2021-04-20 ENCOUNTER — Other Ambulatory Visit: Payer: Medicare PPO | Admitting: *Deleted

## 2021-04-20 ENCOUNTER — Telehealth: Payer: Self-pay

## 2021-04-20 ENCOUNTER — Other Ambulatory Visit: Payer: Self-pay

## 2021-04-20 DIAGNOSIS — I700 Atherosclerosis of aorta: Secondary | ICD-10-CM

## 2021-04-20 DIAGNOSIS — M7061 Trochanteric bursitis, right hip: Secondary | ICD-10-CM | POA: Diagnosis not present

## 2021-04-20 DIAGNOSIS — Z96651 Presence of right artificial knee joint: Secondary | ICD-10-CM | POA: Diagnosis not present

## 2021-04-20 LAB — LIPID PANEL
Chol/HDL Ratio: 2.7 ratio (ref 0.0–4.4)
Cholesterol, Total: 183 mg/dL (ref 100–199)
HDL: 68 mg/dL (ref >39)
LDL Chol Calc (NIH): 106 mg/dL — ABNORMAL HIGH (ref 0–99)
Triglycerides: 45 mg/dL (ref 0–149)
VLDL Cholesterol Cal: 9 mg/dL (ref 5–40)

## 2021-04-20 LAB — ALT: ALT: 6 IU/L (ref 0–32)

## 2021-04-20 MED ORDER — ATORVASTATIN CALCIUM 20 MG PO TABS: 20.0000 mg | ORAL_TABLET | Freq: Every day | ORAL | 3 refills | Status: DC

## 2021-04-20 NOTE — Telephone Encounter (Signed)
-----   Message from Sueanne Margarita, MD sent at 04/20/2021  4:47 PM EDT ----- LDL goal < 70 - please start Lipitor 20mg  daily and repeat FLP and ALT in 6 weeks

## 2021-04-20 NOTE — Telephone Encounter (Signed)
The patient has been notified of the result and verbalized understanding.  All questions (if any) were answered. Antonieta Iba, RN 04/20/2021 5:20 PM  Rx has been sent in for Lipitor 20 mg daily. She will repeat lab work in 6 weeks.

## 2021-04-22 DIAGNOSIS — L814 Other melanin hyperpigmentation: Secondary | ICD-10-CM | POA: Diagnosis not present

## 2021-04-22 DIAGNOSIS — D2271 Melanocytic nevi of right lower limb, including hip: Secondary | ICD-10-CM | POA: Diagnosis not present

## 2021-04-22 DIAGNOSIS — D225 Melanocytic nevi of trunk: Secondary | ICD-10-CM | POA: Diagnosis not present

## 2021-04-22 DIAGNOSIS — L538 Other specified erythematous conditions: Secondary | ICD-10-CM | POA: Diagnosis not present

## 2021-04-22 DIAGNOSIS — D485 Neoplasm of uncertain behavior of skin: Secondary | ICD-10-CM | POA: Diagnosis not present

## 2021-04-22 DIAGNOSIS — Z7189 Other specified counseling: Secondary | ICD-10-CM | POA: Diagnosis not present

## 2021-04-22 DIAGNOSIS — D044 Carcinoma in situ of skin of scalp and neck: Secondary | ICD-10-CM | POA: Diagnosis not present

## 2021-04-22 DIAGNOSIS — L821 Other seborrheic keratosis: Secondary | ICD-10-CM | POA: Diagnosis not present

## 2021-04-22 DIAGNOSIS — D2262 Melanocytic nevi of left upper limb, including shoulder: Secondary | ICD-10-CM | POA: Diagnosis not present

## 2021-04-22 DIAGNOSIS — L820 Inflamed seborrheic keratosis: Secondary | ICD-10-CM | POA: Diagnosis not present

## 2021-05-05 ENCOUNTER — Ambulatory Visit: Payer: Medicare PPO | Admitting: Vascular Surgery

## 2021-05-10 DIAGNOSIS — Z96651 Presence of right artificial knee joint: Secondary | ICD-10-CM | POA: Diagnosis not present

## 2021-05-10 DIAGNOSIS — M25551 Pain in right hip: Secondary | ICD-10-CM | POA: Diagnosis not present

## 2021-05-10 DIAGNOSIS — M7061 Trochanteric bursitis, right hip: Secondary | ICD-10-CM | POA: Diagnosis not present

## 2021-05-12 ENCOUNTER — Ambulatory Visit: Payer: Medicare PPO | Admitting: Vascular Surgery

## 2021-05-23 DIAGNOSIS — D485 Neoplasm of uncertain behavior of skin: Secondary | ICD-10-CM | POA: Diagnosis not present

## 2021-05-23 DIAGNOSIS — L820 Inflamed seborrheic keratosis: Secondary | ICD-10-CM | POA: Diagnosis not present

## 2021-05-23 DIAGNOSIS — L570 Actinic keratosis: Secondary | ICD-10-CM | POA: Diagnosis not present

## 2021-05-23 DIAGNOSIS — D045 Carcinoma in situ of skin of trunk: Secondary | ICD-10-CM | POA: Diagnosis not present

## 2021-05-24 DIAGNOSIS — Z961 Presence of intraocular lens: Secondary | ICD-10-CM | POA: Diagnosis not present

## 2021-05-24 DIAGNOSIS — H16223 Keratoconjunctivitis sicca, not specified as Sjogren's, bilateral: Secondary | ICD-10-CM | POA: Diagnosis not present

## 2021-06-01 DIAGNOSIS — Z96651 Presence of right artificial knee joint: Secondary | ICD-10-CM | POA: Diagnosis not present

## 2021-06-01 DIAGNOSIS — M25551 Pain in right hip: Secondary | ICD-10-CM | POA: Diagnosis not present

## 2021-06-01 DIAGNOSIS — M5459 Other low back pain: Secondary | ICD-10-CM | POA: Diagnosis not present

## 2021-06-09 ENCOUNTER — Other Ambulatory Visit: Payer: Medicare PPO

## 2021-06-09 ENCOUNTER — Other Ambulatory Visit: Payer: Self-pay

## 2021-06-09 DIAGNOSIS — Z1231 Encounter for screening mammogram for malignant neoplasm of breast: Secondary | ICD-10-CM | POA: Diagnosis not present

## 2021-06-09 DIAGNOSIS — I700 Atherosclerosis of aorta: Secondary | ICD-10-CM | POA: Diagnosis not present

## 2021-06-09 LAB — LIPID PANEL
Chol/HDL Ratio: 2.6 ratio (ref 0.0–4.4)
Cholesterol, Total: 193 mg/dL (ref 100–199)
HDL: 75 mg/dL (ref >39)
LDL Chol Calc (NIH): 109 mg/dL — ABNORMAL HIGH (ref 0–99)
Triglycerides: 44 mg/dL (ref 0–149)
VLDL Cholesterol Cal: 9 mg/dL (ref 5–40)

## 2021-06-09 LAB — ALT: ALT: 7 IU/L (ref 0–32)

## 2021-06-10 ENCOUNTER — Telehealth: Payer: Self-pay

## 2021-06-10 DIAGNOSIS — I700 Atherosclerosis of aorta: Secondary | ICD-10-CM

## 2021-06-10 NOTE — Telephone Encounter (Signed)
-----   Message from Sueanne Margarita, MD sent at 06/10/2021  4:09 PM EDT ----- Yes please ----- Message ----- From: Antonieta Iba, RN Sent: 06/10/2021   2:58 PM EDT To: Sueanne Margarita, MD  The patient has been notified of the result and verbalized understanding.  All questions (if any) were answered. Antonieta Iba, RN 06/10/2021 2:49 PM   Patient did not start on her atorvastatin yet. She stated that she wanted to see if changing diet and exercise would bring it down first. I have advised patient to start on atorvastatin 20 mg daily. Patient is agreeable to starting.  Dr. Radford Pax - would you like her to repeat labs in 6 weeks?

## 2021-06-10 NOTE — Telephone Encounter (Signed)
The patient has been notified of the result and verbalized understanding.  All questions (if any) were answered. Antonieta Iba, RN 06/10/2021 4:42 PM  Labs have been scheduled.

## 2021-06-11 DIAGNOSIS — Z85828 Personal history of other malignant neoplasm of skin: Secondary | ICD-10-CM | POA: Diagnosis not present

## 2021-06-11 DIAGNOSIS — G20 Parkinson's disease: Secondary | ICD-10-CM | POA: Diagnosis not present

## 2021-06-11 DIAGNOSIS — Z880 Allergy status to penicillin: Secondary | ICD-10-CM | POA: Diagnosis not present

## 2021-06-11 DIAGNOSIS — Z96651 Presence of right artificial knee joint: Secondary | ICD-10-CM | POA: Diagnosis not present

## 2021-06-11 DIAGNOSIS — R0902 Hypoxemia: Secondary | ICD-10-CM | POA: Diagnosis not present

## 2021-06-11 DIAGNOSIS — I959 Hypotension, unspecified: Secondary | ICD-10-CM | POA: Diagnosis not present

## 2021-06-11 DIAGNOSIS — R001 Bradycardia, unspecified: Secondary | ICD-10-CM | POA: Diagnosis not present

## 2021-06-11 DIAGNOSIS — Z79899 Other long term (current) drug therapy: Secondary | ICD-10-CM | POA: Diagnosis not present

## 2021-06-11 DIAGNOSIS — R739 Hyperglycemia, unspecified: Secondary | ICD-10-CM | POA: Diagnosis not present

## 2021-06-11 DIAGNOSIS — R55 Syncope and collapse: Secondary | ICD-10-CM | POA: Diagnosis not present

## 2021-06-23 DIAGNOSIS — M545 Low back pain, unspecified: Secondary | ICD-10-CM | POA: Diagnosis not present

## 2021-07-01 DIAGNOSIS — G5781 Other specified mononeuropathies of right lower limb: Secondary | ICD-10-CM | POA: Diagnosis not present

## 2021-07-01 DIAGNOSIS — M48062 Spinal stenosis, lumbar region with neurogenic claudication: Secondary | ICD-10-CM | POA: Diagnosis not present

## 2021-07-01 DIAGNOSIS — M5459 Other low back pain: Secondary | ICD-10-CM | POA: Diagnosis not present

## 2021-07-01 DIAGNOSIS — M25551 Pain in right hip: Secondary | ICD-10-CM | POA: Diagnosis not present

## 2021-07-01 DIAGNOSIS — Z96651 Presence of right artificial knee joint: Secondary | ICD-10-CM | POA: Diagnosis not present

## 2021-07-08 DIAGNOSIS — M5416 Radiculopathy, lumbar region: Secondary | ICD-10-CM | POA: Diagnosis not present

## 2021-07-20 DIAGNOSIS — Z96651 Presence of right artificial knee joint: Secondary | ICD-10-CM | POA: Diagnosis not present

## 2021-07-20 DIAGNOSIS — M79604 Pain in right leg: Secondary | ICD-10-CM | POA: Diagnosis not present

## 2021-07-26 ENCOUNTER — Other Ambulatory Visit: Payer: Medicare PPO

## 2021-07-26 DIAGNOSIS — D044 Carcinoma in situ of skin of scalp and neck: Secondary | ICD-10-CM | POA: Diagnosis not present

## 2021-07-26 DIAGNOSIS — I872 Venous insufficiency (chronic) (peripheral): Secondary | ICD-10-CM | POA: Diagnosis not present

## 2021-07-26 DIAGNOSIS — L570 Actinic keratosis: Secondary | ICD-10-CM | POA: Diagnosis not present

## 2021-07-26 DIAGNOSIS — L7211 Pilar cyst: Secondary | ICD-10-CM | POA: Diagnosis not present

## 2021-07-27 DIAGNOSIS — M79604 Pain in right leg: Secondary | ICD-10-CM | POA: Diagnosis not present

## 2021-07-27 DIAGNOSIS — Z96651 Presence of right artificial knee joint: Secondary | ICD-10-CM | POA: Diagnosis not present

## 2021-07-28 ENCOUNTER — Other Ambulatory Visit: Payer: Medicare PPO

## 2021-08-03 DIAGNOSIS — M79604 Pain in right leg: Secondary | ICD-10-CM | POA: Diagnosis not present

## 2021-08-03 DIAGNOSIS — Z96651 Presence of right artificial knee joint: Secondary | ICD-10-CM | POA: Diagnosis not present

## 2021-08-10 DIAGNOSIS — Z96651 Presence of right artificial knee joint: Secondary | ICD-10-CM | POA: Diagnosis not present

## 2021-08-10 DIAGNOSIS — M79604 Pain in right leg: Secondary | ICD-10-CM | POA: Diagnosis not present

## 2021-08-17 DIAGNOSIS — Z96651 Presence of right artificial knee joint: Secondary | ICD-10-CM | POA: Diagnosis not present

## 2021-08-17 DIAGNOSIS — M79604 Pain in right leg: Secondary | ICD-10-CM | POA: Diagnosis not present

## 2021-08-18 ENCOUNTER — Ambulatory Visit: Payer: Medicare PPO | Admitting: Vascular Surgery

## 2021-08-25 DIAGNOSIS — Z96651 Presence of right artificial knee joint: Secondary | ICD-10-CM | POA: Diagnosis not present

## 2021-08-25 DIAGNOSIS — M79604 Pain in right leg: Secondary | ICD-10-CM | POA: Diagnosis not present

## 2021-08-25 DIAGNOSIS — M5416 Radiculopathy, lumbar region: Secondary | ICD-10-CM | POA: Diagnosis not present

## 2021-08-26 DIAGNOSIS — L244 Irritant contact dermatitis due to drugs in contact with skin: Secondary | ICD-10-CM | POA: Diagnosis not present

## 2021-08-26 DIAGNOSIS — D044 Carcinoma in situ of skin of scalp and neck: Secondary | ICD-10-CM | POA: Diagnosis not present

## 2021-08-30 NOTE — Progress Notes (Signed)
Assessment/Plan:   1. ET/PD             -Patient initially diagnosed with Parkinson's disease in 2014 by Dr. Maxine Glenn.  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 Nicki Reaper in 2018. 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  Discussed good prognosis with this             -continue carbidopa/levodopa 25/100, 1.5 tablets at 7 AM/1 tablet at 11 AM/1 tablet at 3 PM/1 tablet at 7 PM  -continue Opicapone, 50 mg at bedtime.  Discussed purpose of opicapone.  Insurance had suggested requip or pramipexole instead (already on requip and pramipexole essentially same).  Will continue on this.  Samples provided due to cost.             -Continue Requip XL, 8 mg daily.             -Continue primidone, 50 mg twice daily.  We discussed the purpose of this medication today.  Not sure that this is helping, but she has a very significant component of essential tremor on the right particularly and would probably need DBS to fix this part.  -She asks me about atorvastatin.  From an ET/PD standpoint I have no objection to this.  Discussed myalgias associated with the medicines, as she was worried about that.  I told her I would trial the medication and see how she does.  She may follow-up with prescribing physician to see if she can lower the dose.    Subjective:   Katie Daniel was seen today in follow up for ET/Parkinsons disease.  My previous records were reviewed prior to todays visit as well as outside records available to me.  Patient was in the emergency room July 9 after a near syncopal episode.  This was a seated event.  The patient was sitting in her chair and daughter noticed her head started to fall to the side.  Patient had recently started gabapentin 3 nights before for pain control after knee replacement.  It was felt that this was due to "being dehydrated, overheated and possibly related to the gabapentin" that the patient had recently started.  She was told  to decrease the gabapentin to half dose.  She did that and is doing better.  No sx's since.    She asks today about whether or not to take atorvastatin.  Worries about potential issues with m pain.    Biggest issue is knee and back pain.  She just had injection for her back and it helped some.  Current prescribed movement disorder medications:  Carbidopa/levodopa 25/100, 1.5 tablet at 7 AM/ 1 tablet at 11 AM/3 PM/7 PM Requip XL, 8 mg daily  Primidone 50 mg,  1 po bid Opicapone, 50 mg at bedtime    PREVIOUS MEDICATIONS:  amantadine (no help for tremor so d/c); carbidopa/levodopa 25/100; requip xl; primidone; entacapone - given but she didn't take it; opicapone   ALLERGIES:   Allergies  Allergen Reactions   Amoxicillin-Pot Clavulanate Other (See Comments)   Latex Other (See Comments)   Penicillins Hives    Did it involve swelling of the face/tongue/throat, SOB, or low BP? No Did it involve sudden or severe rash/hives, skin peeling, or any reaction on the inside of your mouth or nose? Yes Did you need to seek medical attention at a hospital or doctor's office? Yes When did it last happen?  5 years If all above answers are "NO", may proceed with cephalosporin use. Tolerated ancef 03/16/20    CURRENT MEDICATIONS:  Outpatient Encounter Medications as of 09/01/2021  Medication Sig   gabapentin (NEURONTIN) 100 MG capsule Take 100 mg by mouth 3 (three) times daily.   Opicapone (ONGENTYS) 50 MG CAPS Take 50 mg by mouth once for 1 dose. Samples of this drug were given to the patient, quantity 6, Lot Number [id number] 08/02/2024   OVER THE COUNTER MEDICATION Advil with  Acetaminophen - 2-4 tablets daily for hip pain   rOPINIRole (REQUIP XL) 8 MG 24 hr tablet Take 1 tablet (8 mg total) by mouth daily.   [DISCONTINUED] carbidopa-levodopa (SINEMET IR) 25-100 MG tablet 1.5 tablet at 7 AM/ 1 tablet at 11 AM/3 PM/7 PM   [DISCONTINUED] Opicapone (ONGENTYS) 50 MG CAPS Take 1 tablet by mouth daily.  (Patient taking differently: Take 1 tablet by mouth at bedtime.)   [DISCONTINUED] primidone (MYSOLINE) 50 MG tablet Take 1 tablet (50 mg total) by mouth 2 (two) times daily.   carbidopa-levodopa (SINEMET IR) 25-100 MG tablet 1.5 tablet at 7 AM/ 1 tablet at 11 AM/3 PM/7 PM   Opicapone (ONGENTYS) 50 MG CAPS Take 1 tablet by mouth at bedtime.   primidone (MYSOLINE) 50 MG tablet Take 1 tablet (50 mg total) by mouth 2 (two) times daily.   [DISCONTINUED] atorvastatin (LIPITOR) 20 MG tablet Take 1 tablet (20 mg total) by mouth daily. (Patient not taking: No sig reported)   [DISCONTINUED] polyethylene glycol powder (GLYCOLAX/MIRALAX) 17 GM/SCOOP powder  (Patient not taking: Reported on 08/31/2021)   No facility-administered encounter medications on file as of 09/01/2021.    Objective:   PHYSICAL EXAMINATION:    VITALS:   There were no vitals filed for this visit.   GEN:  The patient appears stated age and is in NAD. HEENT:  Normocephalic, atraumatic.  The mucous membranes are moist.  Neurological examination:  Orientation: The patient is alert and oriented x3. Cranial nerves: There is good facial symmetry with facial hypomimia. The speech is fluent and clear. Soft palate rises symmetrically and there is no tongue deviation. Hearing is intact to conversational tone. Sensation: Sensation is intact to light touch throughout Motor: Strength is at least antigravity x4.  Movement examination: Tone: There is mild increased tone in the RUE Abnormal movements: RUE tremor is felt but not seen Coordination:  There is no decremation, with any form of RAMS, including alternating supination and pronation of the forearm, hand opening and closing, finger taps, heel taps and toe taps. Gait and Station: The patient has no difficulty arising out of a deep-seated chair without the use of the hands. 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  Gait is just slightly antalgic  due to knee pain on the right.  I have reviewed and interpreted the following labs independently    Chemistry      Component Value Date/Time   NA 141 03/17/2020 0341   K 4.2 03/17/2020 0341   CL 106 03/17/2020 0341   CO2 28 03/17/2020 0341   BUN 16 03/17/2020 0341   CREATININE 0.62 03/17/2020 0341      Component Value Date/Time   CALCIUM 8.7 (L) 03/17/2020 0341   ALT 7 06/09/2021 0849       Lab Results  Component Value Date   WBC 13.2 (H) 03/17/2020   HGB 11.6 (L) 03/17/2020   HCT 37.2 03/17/2020   MCV 95.1 03/17/2020  PLT 221 03/17/2020    No results found for: TSH   Total time spent on today's visit was 22 minutes, including both face-to-face time and nonface-to-face time.  Time included that spent on review of records (prior notes available to me/labs/imaging if pertinent), discussing treatment and goals, answering patient's questions and coordinating care.  Cc:  Harlan Stains, MD

## 2021-08-31 ENCOUNTER — Other Ambulatory Visit: Payer: Self-pay

## 2021-08-31 ENCOUNTER — Ambulatory Visit: Payer: Medicare PPO | Admitting: Vascular Surgery

## 2021-08-31 ENCOUNTER — Encounter: Payer: Self-pay | Admitting: Vascular Surgery

## 2021-08-31 VITALS — BP 110/66 | HR 73 | Temp 98.0°F | Resp 14 | Ht 66.0 in | Wt 149.1 lb

## 2021-08-31 DIAGNOSIS — I872 Venous insufficiency (chronic) (peripheral): Secondary | ICD-10-CM

## 2021-08-31 DIAGNOSIS — M79604 Pain in right leg: Secondary | ICD-10-CM | POA: Diagnosis not present

## 2021-08-31 DIAGNOSIS — Z96651 Presence of right artificial knee joint: Secondary | ICD-10-CM | POA: Diagnosis not present

## 2021-08-31 NOTE — Progress Notes (Signed)
REASON FOR VISIT:   Follow-up of chronic venous insufficiency.  MEDICAL ISSUES:   CHRONIC VENOUS INSUFFICIENCY: This patient has had significant improvement in her leg swelling with daily leg elevation and compression stockings.  I have encouraged her to continue to elevate her legs daily.  We have discussed the use of her compression stockings especially when she is traveling.  I have encouraged her to avoid prolonged sitting and standing.  We discussed the importance of exercise specifically walking and water aerobics.  Currently she does not have any significant symptoms so at this point I would not recommend laser ablation of her left great saphenous vein.  If her symptoms worsen she will be in touch with [place].  HPI:   Katie Daniel is a pleasant 75 y.o. female who was seen by Laurence Slate, PA on 02/02/2021 with swelling of the left leg.  This is been going on for about 2 years.  This was worse with sitting especially long travel.  Her duplex scan showed significant reflux in the great saphenous vein on the left with no significant deep venous reflux.  She was encouraged to elevate her legs, exercise, and was fitted for thigh-high compression stockings with a gradient of 20 to 30 mmHg.  She comes in for 51-month follow-up visit.  Since she was seen last, she states that her swelling has improved significantly.  She has been elevating her legs daily and this has helped significantly.  She is also been wearing her compression stockings.  She denies any significant aching pain, heaviness, or tired feeling in her legs.  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    34.4 on CT 04/2021   Aortic atherosclerosis ([place])    noted on chest CT 04/2021   Arthritis    DVT (deep venous thrombosis) ([facility])    Essential tremor    Fam hx-ischem heart disease    Fatigue    Hematoma    OF LEFT CALF   Left arm pain    Osteoarthritis    Osteopenia     Parkinson disease ([facility])    Squamous cell skin cancer    Scalp and side of nose   Vitamin D deficiency     Family History  Problem Relation Age of Onset   Heart attack Mother        Died of MI at 23   Stroke Father    Heart attack Father        MI in his 63's   Breast cancer Child    CAD Paternal Uncle        MI in 38's    SOCIAL HISTORY: Social History   Tobacco Use   Smoking status: Never   Smokeless tobacco: Never  Substance Use Topics   Alcohol use: Yes    Comment: glass of wine occ - 1 time per week    Allergies  Allergen Reactions   Amoxicillin-Pot Clavulanate Other (See Comments)   Latex Other (See Comments)   Penicillins Hives    Did it involve swelling of the face/tongue/throat, SOB, or low BP? No Did it involve sudden or severe rash/hives, skin peeling, or any reaction on the inside of your mouth or nose? Yes Did you need to seek medical attention at a hospital or doctor's office? Yes When did it last happen?      5 years If all above answers are "NO", may proceed with cephalosporin use. Tolerated  ancef 03/16/20    Current Outpatient Medications  Medication Sig Dispense Refill   carbidopa-levodopa (SINEMET IR) 25-100 MG tablet 1.5 tablet at 7 AM/ 1 tablet at 11 AM/3 PM/7 PM 405 tablet 1   gabapentin (NEURONTIN) 100 MG capsule Take 100 mg by mouth 3 (three) times daily.     Opicapone (ONGENTYS) 50 MG CAPS Take 1 tablet by mouth daily. 90 capsule 1   OVER THE COUNTER MEDICATION Advil with  Acetaminophen - 2-4 tablets daily for hip pain     primidone (MYSOLINE) 50 MG tablet Take 1 tablet (50 mg total) by mouth 2 (two) times daily. 180 tablet 1   rOPINIRole (REQUIP XL) 8 MG 24 hr tablet Take 1 tablet (8 mg total) by mouth daily. 90 tablet 1   atorvastatin (LIPITOR) 20 MG tablet Take 1 tablet (20 mg total) by mouth daily. (Patient not taking: Reported on 08/31/2021) 90 tablet 3   polyethylene glycol powder (GLYCOLAX/MIRALAX) 17 GM/SCOOP powder  (Patient not  taking: Reported on 08/31/2021)     No current facility-administered medications for this visit.    REVIEW OF SYSTEMS:  [X]  denotes positive finding, [ ]  denotes negative finding Cardiac  Comments:  Chest pain or chest pressure:    Shortness of breath upon exertion:    Short of breath when lying flat:    Irregular heart rhythm:        Vascular    Pain in calf, thigh, or hip brought on by ambulation:    Pain in feet at night that wakes you up from your sleep:     Blood clot in your veins:    Leg swelling:  x       Pulmonary    Oxygen at home:    Productive cough:     Wheezing:         Neurologic    Sudden weakness in arms or legs:     Sudden numbness in arms or legs:     Sudden onset of difficulty speaking or slurred speech:    Temporary loss of vision in one eye:     Problems with dizziness:         Gastrointestinal    Blood in stool:     Vomited blood:         Genitourinary    Burning when urinating:     Blood in urine:        Psychiatric    Major depression:         Hematologic    Bleeding problems:    Problems with blood clotting too easily:        Skin    Rashes or ulcers:        Constitutional    Fever or chills:     PHYSICAL EXAM:   Vitals:   08/31/21 1416  BP: 110/66  Pulse: 73  Resp: 14  Temp: 98 F (36.7 C)  TempSrc: Temporal  SpO2: 99%  Weight: 149 lb 1.6 oz (67.6 kg)  Height: 5\' 6"  (1.676 m)    GENERAL: The patient is a well-nourished female, in no acute distress. The vital signs are documented above. CARDIAC: There is a regular rate and rhythm.  VASCULAR: I do not detect carotid bruits. She has palpable posterior tibial pulses. She has mild bilateral lower extremity swelling. She has reflux in her left great saphenous vein which I looked at myself and this is moderately dilated down to the knee. PULMONARY: There is good air exchange bilaterally  without wheezing or rales. ABDOMEN: Soft and non-tender with normal pitched bowel sounds.   MUSCULOSKELETAL: There are no major deformities or cyanosis. NEUROLOGIC: No focal weakness or paresthesias are detected. SKIN: There are no ulcers or rashes noted. PSYCHIATRIC: The patient has a normal affect.  DATA:    VENOUS DUPLEX: I have reviewed her venous duplex scan from 02/02/2021.  This was of the left lower extremity only.  There was no evidence of DVT.  There was no significant deep venous reflux.  There was superficial venous reflux from the saphenofemoral junction to the proximal calf.  Diameters of the vein in the thigh ranged from 4.5 to 5 mm.  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       Deitra Mayo [facility] [phone]

## 2021-09-01 ENCOUNTER — Ambulatory Visit: Payer: Medicare PPO | Admitting: Neurology

## 2021-09-01 ENCOUNTER — Encounter: Payer: Self-pay | Admitting: Neurology

## 2021-09-01 DIAGNOSIS — M5459 Other low back pain: Secondary | ICD-10-CM | POA: Diagnosis not present

## 2021-09-01 DIAGNOSIS — M25551 Pain in right hip: Secondary | ICD-10-CM | POA: Diagnosis not present

## 2021-09-01 DIAGNOSIS — G20 Parkinson's disease: Secondary | ICD-10-CM

## 2021-09-01 DIAGNOSIS — G5781 Other specified mononeuropathies of right lower limb: Secondary | ICD-10-CM | POA: Diagnosis not present

## 2021-09-01 DIAGNOSIS — M48062 Spinal stenosis, lumbar region with neurogenic claudication: Secondary | ICD-10-CM | POA: Diagnosis not present

## 2021-09-01 DIAGNOSIS — Z96651 Presence of right artificial knee joint: Secondary | ICD-10-CM | POA: Diagnosis not present

## 2021-09-01 DIAGNOSIS — M4316 Spondylolisthesis, lumbar region: Secondary | ICD-10-CM | POA: Diagnosis not present

## 2021-09-01 MED ORDER — ONGENTYS 50 MG PO CAPS
1.0000 | ORAL_CAPSULE | Freq: Every day | ORAL | 1 refills | Status: DC
Start: 2021-09-01 — End: 2021-10-19

## 2021-09-01 MED ORDER — PRIMIDONE 50 MG PO TABS
50.0000 mg | ORAL_TABLET | Freq: Two times a day (BID) | ORAL | 1 refills | Status: DC
Start: 2021-09-01 — End: 2022-01-11

## 2021-09-01 MED ORDER — CARBIDOPA-LEVODOPA 25-100 MG PO TABS
ORAL_TABLET | ORAL | 1 refills | Status: DC
Start: 2021-09-01 — End: 2022-03-02

## 2021-09-01 MED ORDER — ONGENTYS 50 MG PO CAPS: 50.0000 mg | ORAL_CAPSULE | Freq: Once | ORAL | 0 refills | Status: AC

## 2021-09-07 DIAGNOSIS — Z96651 Presence of right artificial knee joint: Secondary | ICD-10-CM | POA: Diagnosis not present

## 2021-09-07 DIAGNOSIS — M79604 Pain in right leg: Secondary | ICD-10-CM | POA: Diagnosis not present

## 2021-09-14 DIAGNOSIS — Z96651 Presence of right artificial knee joint: Secondary | ICD-10-CM | POA: Diagnosis not present

## 2021-09-14 DIAGNOSIS — M79604 Pain in right leg: Secondary | ICD-10-CM | POA: Diagnosis not present

## 2021-09-16 DIAGNOSIS — M5416 Radiculopathy, lumbar region: Secondary | ICD-10-CM | POA: Diagnosis not present

## 2021-10-04 DIAGNOSIS — L538 Other specified erythematous conditions: Secondary | ICD-10-CM | POA: Diagnosis not present

## 2021-10-04 DIAGNOSIS — D044 Carcinoma in situ of skin of scalp and neck: Secondary | ICD-10-CM | POA: Diagnosis not present

## 2021-10-04 DIAGNOSIS — L820 Inflamed seborrheic keratosis: Secondary | ICD-10-CM | POA: Diagnosis not present

## 2021-10-04 DIAGNOSIS — X32XXXA Exposure to sunlight, initial encounter: Secondary | ICD-10-CM | POA: Diagnosis not present

## 2021-10-04 DIAGNOSIS — L821 Other seborrheic keratosis: Secondary | ICD-10-CM | POA: Diagnosis not present

## 2021-10-04 DIAGNOSIS — L298 Other pruritus: Secondary | ICD-10-CM | POA: Diagnosis not present

## 2021-10-04 DIAGNOSIS — L570 Actinic keratosis: Secondary | ICD-10-CM | POA: Diagnosis not present

## 2021-10-19 ENCOUNTER — Other Ambulatory Visit: Payer: Self-pay | Admitting: Neurology

## 2021-10-19 DIAGNOSIS — G250 Essential tremor: Secondary | ICD-10-CM

## 2021-10-19 DIAGNOSIS — M5416 Radiculopathy, lumbar region: Secondary | ICD-10-CM | POA: Diagnosis not present

## 2021-10-19 DIAGNOSIS — G20 Parkinson's disease: Secondary | ICD-10-CM

## 2021-10-20 DIAGNOSIS — D044 Carcinoma in situ of skin of scalp and neck: Secondary | ICD-10-CM | POA: Diagnosis not present

## 2021-10-20 DIAGNOSIS — L570 Actinic keratosis: Secondary | ICD-10-CM | POA: Diagnosis not present

## 2021-10-20 DIAGNOSIS — X32XXXA Exposure to sunlight, initial encounter: Secondary | ICD-10-CM | POA: Diagnosis not present

## 2021-10-26 ENCOUNTER — Other Ambulatory Visit: Payer: Self-pay | Admitting: Neurology

## 2021-11-02 DIAGNOSIS — M8589 Other specified disorders of bone density and structure, multiple sites: Secondary | ICD-10-CM | POA: Diagnosis not present

## 2021-11-02 DIAGNOSIS — M4316 Spondylolisthesis, lumbar region: Secondary | ICD-10-CM | POA: Diagnosis not present

## 2021-11-02 DIAGNOSIS — Z780 Asymptomatic menopausal state: Secondary | ICD-10-CM | POA: Diagnosis not present

## 2021-11-02 DIAGNOSIS — M5459 Other low back pain: Secondary | ICD-10-CM | POA: Diagnosis not present

## 2021-11-02 DIAGNOSIS — Z96651 Presence of right artificial knee joint: Secondary | ICD-10-CM | POA: Diagnosis not present

## 2021-11-02 DIAGNOSIS — M5416 Radiculopathy, lumbar region: Secondary | ICD-10-CM | POA: Diagnosis not present

## 2021-11-15 DIAGNOSIS — M533 Sacrococcygeal disorders, not elsewhere classified: Secondary | ICD-10-CM | POA: Diagnosis not present

## 2021-11-15 DIAGNOSIS — M5416 Radiculopathy, lumbar region: Secondary | ICD-10-CM | POA: Diagnosis not present

## 2021-11-17 ENCOUNTER — Telehealth: Payer: Self-pay

## 2021-11-17 DIAGNOSIS — G20 Parkinson's disease: Secondary | ICD-10-CM | POA: Diagnosis not present

## 2021-11-17 DIAGNOSIS — R55 Syncope and collapse: Secondary | ICD-10-CM | POA: Diagnosis not present

## 2021-11-17 DIAGNOSIS — Z9989 Dependence on other enabling machines and devices: Secondary | ICD-10-CM | POA: Diagnosis not present

## 2021-11-17 DIAGNOSIS — E860 Dehydration: Secondary | ICD-10-CM | POA: Diagnosis not present

## 2021-11-17 DIAGNOSIS — Z79899 Other long term (current) drug therapy: Secondary | ICD-10-CM | POA: Diagnosis not present

## 2021-11-17 DIAGNOSIS — R569 Unspecified convulsions: Secondary | ICD-10-CM | POA: Diagnosis not present

## 2021-11-17 NOTE — Telephone Encounter (Signed)
New message   Your information has been sent to [facility].  Katie Daniel (Key: [id number]) Ongentys 50MG  capsules   Form [employer] Electronic PA Form Created 1 day ago Sent to Plan 23 minutes ago Plan Response 22 minutes ago Submit Clinical Questions less than a minute ago Determination Wait for Determination Please wait for [facility] NCPDP 2017 to return a determination.

## 2021-11-22 NOTE — Telephone Encounter (Signed)
Katie Daniel (Key: [id number]) Ongentys 50MG  capsules   Form [employer] Electronic PA Form Created 6 days ago Sent to Plan 5 days ago Plan Response 5 days ago Submit Clinical Questions 5 days ago Determination Favorable 5 days ago Message from Plan PA Case: [id number], Status: Approved, Coverage Starts on: 12/04/2020 12:00:00 AM, Coverage Ends on: 12/03/2022 12:00:00 AM. Questions? Contact [phone].

## 2021-11-30 ENCOUNTER — Encounter: Payer: Self-pay | Admitting: Neurology

## 2021-11-30 DIAGNOSIS — R52 Pain, unspecified: Secondary | ICD-10-CM | POA: Diagnosis not present

## 2021-11-30 DIAGNOSIS — R6883 Chills (without fever): Secondary | ICD-10-CM | POA: Diagnosis not present

## 2021-11-30 DIAGNOSIS — U071 COVID-19: Secondary | ICD-10-CM | POA: Diagnosis not present

## 2021-11-30 DIAGNOSIS — R059 Cough, unspecified: Secondary | ICD-10-CM | POA: Diagnosis not present

## 2021-12-09 DIAGNOSIS — D049 Carcinoma in situ of skin, unspecified: Secondary | ICD-10-CM | POA: Diagnosis not present

## 2021-12-16 DIAGNOSIS — L570 Actinic keratosis: Secondary | ICD-10-CM | POA: Diagnosis not present

## 2021-12-16 DIAGNOSIS — D044 Carcinoma in situ of skin of scalp and neck: Secondary | ICD-10-CM | POA: Diagnosis not present

## 2021-12-16 DIAGNOSIS — L648 Other androgenic alopecia: Secondary | ICD-10-CM | POA: Diagnosis not present

## 2021-12-30 DIAGNOSIS — D044 Carcinoma in situ of skin of scalp and neck: Secondary | ICD-10-CM | POA: Diagnosis not present

## 2021-12-30 DIAGNOSIS — L570 Actinic keratosis: Secondary | ICD-10-CM | POA: Diagnosis not present

## 2021-12-30 DIAGNOSIS — D485 Neoplasm of uncertain behavior of skin: Secondary | ICD-10-CM | POA: Diagnosis not present

## 2022-01-03 DIAGNOSIS — M533 Sacrococcygeal disorders, not elsewhere classified: Secondary | ICD-10-CM | POA: Diagnosis not present

## 2022-01-10 ENCOUNTER — Other Ambulatory Visit: Payer: Self-pay | Admitting: Neurology

## 2022-01-10 DIAGNOSIS — G250 Essential tremor: Secondary | ICD-10-CM

## 2022-01-10 DIAGNOSIS — G20 Parkinson's disease: Secondary | ICD-10-CM

## 2022-01-11 ENCOUNTER — Other Ambulatory Visit: Payer: Self-pay

## 2022-01-11 DIAGNOSIS — G20 Parkinson's disease: Secondary | ICD-10-CM

## 2022-01-11 DIAGNOSIS — G250 Essential tremor: Secondary | ICD-10-CM

## 2022-01-23 DIAGNOSIS — M533 Sacrococcygeal disorders, not elsewhere classified: Secondary | ICD-10-CM | POA: Diagnosis not present

## 2022-01-28 ENCOUNTER — Other Ambulatory Visit: Payer: Self-pay | Admitting: Neurology

## 2022-01-28 DIAGNOSIS — G20 Parkinson's disease: Secondary | ICD-10-CM

## 2022-01-28 DIAGNOSIS — G250 Essential tremor: Secondary | ICD-10-CM

## 2022-01-30 ENCOUNTER — Other Ambulatory Visit: Payer: Self-pay

## 2022-01-30 MED ORDER — PRIMIDONE 50 MG PO TABS: 50.0000 mg | ORAL_TABLET | Freq: Two times a day (BID) | ORAL | 0 refills | Status: DC

## 2022-01-31 DIAGNOSIS — S46011A Strain of muscle(s) and tendon(s) of the rotator cuff of right shoulder, initial encounter: Secondary | ICD-10-CM | POA: Diagnosis not present

## 2022-01-31 DIAGNOSIS — M25511 Pain in right shoulder: Secondary | ICD-10-CM | POA: Diagnosis not present

## 2022-02-01 DIAGNOSIS — Z0000 Encounter for general adult medical examination without abnormal findings: Secondary | ICD-10-CM | POA: Diagnosis not present

## 2022-02-01 DIAGNOSIS — G20 Parkinson's disease: Secondary | ICD-10-CM | POA: Diagnosis not present

## 2022-02-01 DIAGNOSIS — Z79899 Other long term (current) drug therapy: Secondary | ICD-10-CM | POA: Diagnosis not present

## 2022-02-01 DIAGNOSIS — G250 Essential tremor: Secondary | ICD-10-CM | POA: Diagnosis not present

## 2022-02-01 DIAGNOSIS — R030 Elevated blood-pressure reading, without diagnosis of hypertension: Secondary | ICD-10-CM | POA: Diagnosis not present

## 2022-02-01 DIAGNOSIS — R55 Syncope and collapse: Secondary | ICD-10-CM | POA: Diagnosis not present

## 2022-02-01 DIAGNOSIS — E559 Vitamin D deficiency, unspecified: Secondary | ICD-10-CM | POA: Diagnosis not present

## 2022-02-01 DIAGNOSIS — M8588 Other specified disorders of bone density and structure, other site: Secondary | ICD-10-CM | POA: Diagnosis not present

## 2022-02-01 DIAGNOSIS — I700 Atherosclerosis of aorta: Secondary | ICD-10-CM | POA: Diagnosis not present

## 2022-02-11 DIAGNOSIS — M75111 Incomplete rotator cuff tear or rupture of right shoulder, not specified as traumatic: Secondary | ICD-10-CM | POA: Diagnosis not present

## 2022-02-11 DIAGNOSIS — M25411 Effusion, right shoulder: Secondary | ICD-10-CM | POA: Diagnosis not present

## 2022-02-11 DIAGNOSIS — S46811A Strain of other muscles, fascia and tendons at shoulder and upper arm level, right arm, initial encounter: Secondary | ICD-10-CM | POA: Diagnosis not present

## 2022-02-28 DIAGNOSIS — M75111 Incomplete rotator cuff tear or rupture of right shoulder, not specified as traumatic: Secondary | ICD-10-CM | POA: Diagnosis not present

## 2022-02-28 NOTE — Progress Notes (Addendum)
? ? ?Assessment/Plan:  ? ?1. ET/PD ?            -Patient initially diagnosed with Parkinson's disease in 2014 by Dr. Maxine Glenn.  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 Nicki Reaper in 2018. 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  Discussed good prognosis with this ?            -continue carbidopa/levodopa 25/100, 1.5 tablets at 7 AM/1 tablet at 11 AM/1 tablet at 3 PM/1 tablet at 7 PM ? -add carbidopa/levodopa 50/200 CR at bed to help middle of the night stutter steps and first AM on.  Discussed with her that [facility] does not pay for this.  Discussed alternative pain options and printed these out for her, including [place] and good Rx. ? -continue Opicapone, 50 mg at bedtime.  Discussed purpose of opicapone.  Insurance had suggested requip or pramipexole instead (already on requip and pramipexole essentially same).  Will continue on this.  Samples provided due to cost. ?            -Continue Requip XL, 8 mg daily. ?            -Continue primidone, 50 mg twice daily.  We discussed the purpose of this medication today.  Not sure that this is helping, but she has a very significant component of essential tremor on the right particularly and would probably need DBS to fix this part. ? ? ?2.  Syncope ? -she was not orthostatic in the office today ? -she has had low BP's in the AM's but not convinced that these are Parkinsons Disease related ? -her syncope/near syncope occurred while seated last July and this past December and this is more evidence that its not related to Neurogenic Orthostatic Hypotension  ? -she has seen Dr. Radford Pax, but they have not discussed this.  She has not seen her in about a year.  I encouraged the patient to follow-up, but also will send ACT return a copy of these notes today. ? ? ? ?Subjective:  ? ?Katie Daniel was seen today in follow up for ET/Parkinsons disease.  My previous records were reviewed prior to todays visit as well as outside  records available to me.  Pt with another episode of passing out in [place] in December.  She was seated when it happened.  She was evalated in th ER.  She was told she was dehydrated.  This is very similar to her episode last July when patient was sitting in her chair and daughter noticed her head started to fall to the side.  Patient had recently started gabapentin 3 nights before for pain control after knee replacement.  It was felt that this was due to "being dehydrated, overheated and possibly related to the gabapentin" that the patient had recently started.  She was no longer on the gabapentin when the December event happened.  She is still noting low blood pressure in the AM.  She brings some readings today from the mornings, with the systolics in the 67'M.  She feels just a bit lightheaded in the morning but that is better in the day.  She is noting more trouble with ambulating in the middle of the night.  She wants to be able to help her daughter with new baby in the middle of the night but worries about stutter steps.   ? ? ? ?Current prescribed movement disorder medications: ? Carbidopa/levodopa 25/100, 1.5  tablet at 7 AM/ 1 tablet at 11 AM/3 PM/7 PM ?Requip XL, 8 mg daily ? Primidone 50 mg,  1 po bid ?Opicapone, 50 mg at bedtime  ?  ?PREVIOUS MEDICATIONS:  amantadine (no help for tremor so d/c); carbidopa/levodopa 25/100; requip xl; primidone; entacapone - given but she didn't take it; opicapone ? ? ?ALLERGIES:   ?Allergies  ?Allergen Reactions  ? Amoxicillin-Pot Clavulanate Other (See Comments)  ? Latex Other (See Comments)  ? Penicillins Hives  ?  Did it involve swelling of the face/tongue/throat, SOB, or low BP? No ?Did it involve sudden or severe rash/hives, skin peeling, or any reaction on the inside of your mouth or nose? Yes ?Did you need to seek medical attention at a hospital or doctor's office? Yes ?When did it last happen?      5 years ?If all above answers are "NO", may proceed with  cephalosporin use. ?Tolerated ancef 03/16/20  ? ? ?CURRENT MEDICATIONS:  ?Outpatient Encounter Medications as of 03/02/2022  ?Medication Sig  ? carbidopa-levodopa (SINEMET IR) 25-100 MG tablet 1.5 tablet at 7 AM/ 1 tablet at 11 AM/3 PM/7 PM  ? Opicapone (ONGENTYS) 50 MG CAPS TAKE 1 CAPSULE BY MOUTH DAILY  ? primidone (MYSOLINE) 50 MG tablet Take 1 tablet (50 mg total) by mouth 2 (two) times daily.  ? rOPINIRole (REQUIP XL) 8 MG 24 hr tablet TAKE 1 TABLET(8 MG) BY MOUTH DAILY  ? gabapentin (NEURONTIN) 100 MG capsule Take 100 mg by mouth 3 (three) times daily.  ? OVER THE COUNTER MEDICATION Advil with  Acetaminophen - 2-4 tablets daily for hip pain  ? ?No facility-administered encounter medications on file as of 03/02/2022.  ? ? ?Objective:  ? ?PHYSICAL EXAMINATION:   ? ?VITALS:   ?Vitals:  ? 03/02/22 0918  ?BP: 120/73  ?Pulse: 82  ?SpO2: 98%  ?Weight: 148 lb (67.1 kg)  ?Height: '5\' 5"'$  (1.651 m)  ? ?Orthostatic VS for the past 72 hrs (Last 3 readings): ? Orthostatic BP Patient Position BP Location Orthostatic Pulse  ?03/02/22 0951 110/70 Standing Left Arm 75  ?03/02/22 0950 110/70 Sitting Left Arm 75  ?03/02/22 0949 122/78 Prone Left Arm 74  ? ? ? ? ?GEN:  The patient appears stated age and is in NAD. ?HEENT:  Normocephalic, atraumatic.  The mucous membranes are moist.  ?Neurological examination: ? ?Orientation: The patient is alert and oriented x3. ?Cranial nerves: There is good facial symmetry with facial hypomimia. The speech is fluent and clear. Soft palate rises symmetrically and there is no tongue deviation. Hearing is intact to conversational tone. ?Sensation: Sensation is intact to light touch throughout ?Motor: Strength is at least antigravity x4. ? ?Movement examination: ?Tone: There is normal tone in the upper and lower extremities today. ?Abnormal movements: None today ?Coordination:  There is no decremation, with any form of RAMS, including alternating supination and pronation of the forearm, hand opening and  closing, finger taps, heel taps and toe taps. ?Gait and Station: The patient has no difficulty arising out of a deep-seated chair without the use of the hands. 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  Gait is just slightly antalgic. ? ?I have reviewed and interpreted the following labs independently ? ?  Chemistry   ?   ?Component Value Date/Time  ? NA 141 03/17/2020 0341  ? K 4.2 03/17/2020 0341  ? CL 106 03/17/2020 0341  ? CO2 28 03/17/2020 0341  ? BUN 16 03/17/2020 0341  ?  CREATININE 0.62 03/17/2020 0341  ?    ?Component Value Date/Time  ? CALCIUM 8.7 (L) 03/17/2020 0341  ? ALT 7 06/09/2021 0849  ?  ? ? ? ?Lab Results  ?Component Value Date  ? WBC 13.2 (H) 03/17/2020  ? HGB 11.6 (L) 03/17/2020  ? HCT 37.2 03/17/2020  ? MCV 95.1 03/17/2020  ? PLT 221 03/17/2020  ? ? ?No results found for: TSH ? ? ?Total time spent on today's visit was 35 minutes, including both face-to-face time and nonface-to-face time.  Time included that spent on review of records (prior notes available to me/labs/imaging if pertinent), discussing treatment and goals, answering patient's questions and coordinating care. ? ?Cc:  Harlan Stains, MD ? ?

## 2022-03-02 ENCOUNTER — Encounter: Payer: Self-pay | Admitting: Neurology

## 2022-03-02 ENCOUNTER — Ambulatory Visit: Payer: Medicare PPO | Admitting: Neurology

## 2022-03-02 VITALS — BP 120/73 | HR 82 | Ht 65.0 in | Wt 148.0 lb

## 2022-03-02 DIAGNOSIS — G250 Essential tremor: Secondary | ICD-10-CM | POA: Diagnosis not present

## 2022-03-02 DIAGNOSIS — G20 Parkinson's disease: Secondary | ICD-10-CM

## 2022-03-02 DIAGNOSIS — R55 Syncope and collapse: Secondary | ICD-10-CM

## 2022-03-02 MED ORDER — ROPINIROLE HCL ER 8 MG PO TB24: ORAL_TABLET | ORAL | 1 refills | Status: DC

## 2022-03-02 MED ORDER — ONGENTYS 50 MG PO CAPS: 1.0000 | ORAL_CAPSULE | Freq: Every day | ORAL | 1 refills | Status: DC

## 2022-03-02 MED ORDER — CARBIDOPA-LEVODOPA ER 50-200 MG PO TBCR: 1.0000 | EXTENDED_RELEASE_TABLET | Freq: Every day | ORAL | 1 refills | Status: DC

## 2022-03-02 MED ORDER — CARBIDOPA-LEVODOPA 25-100 MG PO TABS: ORAL_TABLET | ORAL | 1 refills | Status: DC

## 2022-03-02 NOTE — Patient Instructions (Signed)
Local and Online Resources for Power over Parkinson's Group ?March 2023 ? ?LOCAL [place] PARKINSON'S GROUPS  ?Power over Parkinson's Group :   ?Power Over Parkinson's Patient Education Group will be Wednesday, March 8th-*Hybrid meting*- in person at [place] location and via [facility] at 2:00 pm.   ?Upcoming Power over Parkinson's Meetings:  2nd Wednesdays of the month at 2 pm:  March 8th, April 12th ?Contact Amy Marriott at [email or web address] if interested in participating in this group ?Parkinson's Care Partners Group:    3rd Mondays, Contact Misty Paladino ?Atypical Parkinsonian Patient Group:   4th Wednesdays, Contact Misty Paladino ?If you are interested in participating in these groups with Misty, please contact her directly for how to join those meetings.  Her contact information is [email or web address].   ? ?LOCAL EVENTS AND NEW OFFERINGS ?Parkinson's Wellness Event:  Pottery Night at [facility] [place].  Friday, March 10th 5:30-7:30 pm.  Sponsored by [employer] [place].  FREE event for people with Parkinson's and care partners.  RSVP to [facility] at [email or web address] ?Dine out at [employer].  Celebrate Parkinson's disease Awareness Month and Support the Parkinson's Movement Disorder Fund.   Wednesday, April 19th 4-6 pm at [place], [employer].  (Give receipt to cashier and 20% will be donated) ?Parkinson's T-shirts for sale!  Designed by a local group member, with funds going to [employer].  $20.00  Contact Misty to purchase (see email above) ?New PWR! Moves Class offering at [employer]!  Fridays 1-2 pm in March (likely to switch days and times after March).  Come try it out and see if PWR! Moves is a good fit for your exercise routine!  Contact Amy Marriott for details:  [email or web address] ?Hamil-Kerr Challenge Bike, Run, Walk for PD, PSP, MSA.    Saturday, April 8th at 8 am.  [facility]   Proceeds go to offset costs of exercise programs locally.  To Register, visit website:  www.[email or web address] ? ?ONLINE EDUCATION AND SUPPORT ?[place]:  www.[email or web address] ?PD Health at Home continues:  Mindfulness Mondays, Expert Briefing Tuesdays, Wellness Wednesdays, Take Time Thursdays, Fitness Fridays  ?Upcoming Education:  ?Parkinson's and Medications:  What's New.  Wednesday, March 8th at 1:00 pm. ?Freezing and Fall Prevention in Parkinson's.  Wednesday, April 12th at 1:00 pm ?Register for Armed forces operational officer) at WatchCalls.si ?[place] Chapter Parkinson's Symposium: Cognition Changes- a free in-person ([place], [place]) and online Audiological scientist) event for people with Parkinson's and their loved ones. During this event we will explore new treatments and practical strategies for addressing these changes.  Saturday, April 1st, 10 am-1 pm.  Register at [employer].[email or web address]/ or contact Dorothy Puffer Gruccio at [phone] or [email or web address]. ? Please check out their website to sign up for emails and see their full online offerings ? ?[place]:  www.[email or web address]  ?Third Thursday Webinars:  On the third Thursday of every month at 12 p.m. ET, join our free live webinars to learn about various aspects of living with Parkinson's disease and our work to speed medical breakthroughs. ?Upcoming Webinar:  The Power of Women in Philanthropy.  Tues, March 28th at  12 pm. ?Check out additional information on their website to see their full online offerings ? ?[place]:  www.[email or web address] ?Upcoming Webinar:   Stay tuned ?Webinar Series:  Living with Parkinson's Meetup.   Third Thursdays of each month at 3 pm ?Care Partner Monthly Meetup.  With Robin Searing [place].  First Tuesday of each month, 2 pm ?Check out additional information  to Live Well Today on their  website ? ?Parkinson and Movement Disorders (PMD) Alliance:  www.pmdalliance.org ?[employer]Life Online:  Online Education Events ?Sign up for emails, which are sent weekly to give you updates on programming and online offerings ? ?Parkinson's Association of the [place]:  www.parkinsonassociation.org ?Information on online support groups, education events, and online exercises including Yoga, Parkinson's exercises and more-LOTS of information on links to PD resources and online events ?Virtual Support Group through [employer] of the [place]; next one is scheduled for Wednesday, *April 5th at 2 pm. *March meeting has been cancelled. (These are typically scheduled for the 1st Wednesday of the month at 2 pm).  Visit website for details. ?MOVEMENT AND EXERCISE OPPORTUNITIES ?Parkinson's DRUMMING Classes/Music Therapy with Doylene Canning:  This is a returning class and it's FREE!  2nd Mondays, continuing March 13th , 11:00 at the [place].  Contact *Misty Taylor-Paladino at [email or web address] or Doylene Canning at [phone] or [email or web address]  ?PWR! Moves Classes at [employer].  Wednesdays 10 and 11 am.   NEW PWR! Moves Class offering at [employer].  Fridays 1-2 pm.  Contact Amy Marriott, PT [email or web address] if interested. ?Here is a link to the PWR!Moves classes on Zoom from [place] - Daily Mon-Sat at 10:00. Via Zoom, FREE and open to all.  There is also a link below via Facebook if you use that platform. ?[email or web address].si ?[email or web address]://[email or web address] ? ?Parkinson's Wellness Recovery (PWR! Moves)  www.pwr4life.org ?Info on the PWR! Virtual Experience:  You will have access to our expertise through  self-assessment, guided plans that start with the PD-specific fundamentals, educational content, tips, Q&A with an expert, and a growing Art therapist of PD-specific pre-recorded and live exercise classes of varying types and intensity - both physical and cognitive! If that is not enough, we offer 1:1 wellness consultations (in-person or virtual) to personalize your PWR! Research scientist (medical).  ?[employer] Fridays:  ?As part of the PD Health @ Home program, this free video series focuses each week on one aspect of fitness designed to support people living with Parkinson's.  These weekly videos highlight the [place] recent fitness guidelines for people with Parkinson's disease. ?www.KVTVnet.com.cy ?Dance for PD website is offering free, live-stream classes throughout the week, as well as links to [employer] of classes:  https://danceforparkinsons.org/ ?Dance for Parkinson's in-person class.  February 1-April 26, Wednesdays 4-5 pm.  Free class for people with Parkinson's disease, at [address] [place], [place].  Contact [phone] or [email or web address] to register ?Virtual dance and Pilates for Parkinson's classes: Click on the Community Tab> Parkinson's Movement Initiative Tab.  To register for classes and for more information, visit www.SeekAlumni.co.za and click the ?community? tab.  ?[employer] Parkinson's Cycling Classes  ?[employer]:  Thursdays @ Noon-Live classes at [employer] ([employer] at [email or web address] or [phone]) ?[facility] [employer] [employer]: Virtual Classes Mondays and Thursdays Jeanette Caprice classes Tuesday, Wednesday and Thursday (contact [place] at [email or web address]  or [phone]) ?[employer] ?Varied levels of classes are offered Mondays, Tuesdays and Thursdays at [employer].  ?Stretching with Verdis Frederickson weekly class is also offered for people with  Parkinson's ?To observe a class or for more information, call [phone] or email Hezzie Bump at [email or web address] ?ADDITIONAL SUPPORT AND RESOURCES ?[employer]:Online Caregiver Education Opportunities:

## 2022-03-03 ENCOUNTER — Telehealth: Payer: Self-pay

## 2022-03-03 DIAGNOSIS — R55 Syncope and collapse: Secondary | ICD-10-CM

## 2022-03-03 NOTE — Telephone Encounter (Signed)
-----   Message from Sueanne Margarita, MD sent at 03/02/2022  3:47 PM EDT ----- ?Carly definitely agree to refer to EP for loop recorder please send to Dr. Quentin Ore or Dr. Lovena Le ?----- Message ----- ?From: Ludwig Clarks, DO ?Sent: 03/02/2022   3:35 PM EDT ?To: Sueanne Margarita, MD ? ?Yes we did.  She was not orthostatic at all in our office today.  Todays readings: ?03/02/22 0951 110/70   Standing Left Arm 75 ?03/02/22 0950 110/70   Sitting Left Arm 75 ?03/02/22 0949 122/78   Prone  Left Arm 74 ? ? ?----- Message ----- ?From: Sueanne Margarita, MD ?Sent: 03/02/2022   3:22 PM EDT ?To: Eustace Quail Tat, DO, Antonieta Iba, RN ? ?My thoughts are possibly a systolic blood pressure of 80 could make her dizzy enough to fall out while sitting in a chair.  Did you check orthostatic blood pressures in the office?  Obviously the main thing we worry about when somebody has syncope seated is an arrhythmia. ? ?Carlye please get her referred to EP for consideration of a loop recorder ?----- Message ----- ?From: Ludwig Clarks, DO ?Sent: 03/02/2022  11:07 AM EDT ?To: Sueanne Margarita, MD ? ?I wanted you to be aware of pts syncopal episodes.  I don't think that they are Neurogenic Orthostatic Hypotension associated with her Parkinsons Disease as they have occurred while seated.  Orthostatics were negative in my office.  I would love your opinion!  Thanks!  Wells Guiles ? ? ? ?

## 2022-03-03 NOTE — Telephone Encounter (Signed)
Referral has been placed. 

## 2022-03-06 DIAGNOSIS — D049 Carcinoma in situ of skin, unspecified: Secondary | ICD-10-CM | POA: Diagnosis not present

## 2022-03-07 ENCOUNTER — Ambulatory Visit: Payer: Medicare PPO | Admitting: Neurology

## 2022-03-09 DIAGNOSIS — L308 Other specified dermatitis: Secondary | ICD-10-CM | POA: Diagnosis not present

## 2022-03-09 DIAGNOSIS — D044 Carcinoma in situ of skin of scalp and neck: Secondary | ICD-10-CM | POA: Diagnosis not present

## 2022-03-14 DIAGNOSIS — L308 Other specified dermatitis: Secondary | ICD-10-CM | POA: Diagnosis not present

## 2022-03-14 DIAGNOSIS — D0439 Carcinoma in situ of skin of other parts of face: Secondary | ICD-10-CM | POA: Diagnosis not present

## 2022-03-14 DIAGNOSIS — X32XXXA Exposure to sunlight, initial encounter: Secondary | ICD-10-CM | POA: Diagnosis not present

## 2022-03-14 DIAGNOSIS — D485 Neoplasm of uncertain behavior of skin: Secondary | ICD-10-CM | POA: Diagnosis not present

## 2022-03-28 DIAGNOSIS — M75111 Incomplete rotator cuff tear or rupture of right shoulder, not specified as traumatic: Secondary | ICD-10-CM | POA: Diagnosis not present

## 2022-03-28 DIAGNOSIS — L308 Other specified dermatitis: Secondary | ICD-10-CM | POA: Diagnosis not present

## 2022-04-05 DIAGNOSIS — M75111 Incomplete rotator cuff tear or rupture of right shoulder, not specified as traumatic: Secondary | ICD-10-CM | POA: Diagnosis not present

## 2022-04-15 IMAGING — US US EXTREM LOW VENOUS*L*
1 series · 13 of 24 positions shown · non-contrast
Comparison: None.

CLINICAL DATA: Increased swelling, recent left GSV thrombophlebitis



[Series 1: us extrem low venous*left* · 0.08mm/px · 13 of 33 slices shown]
[im 1/33]
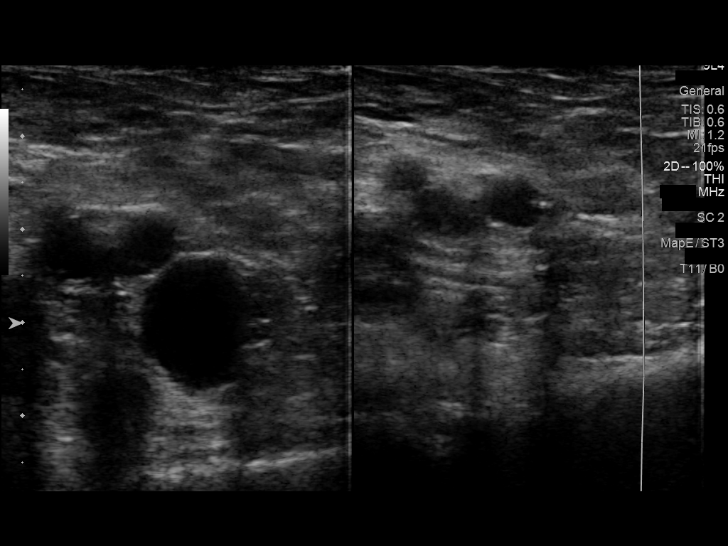
[im 3/33]
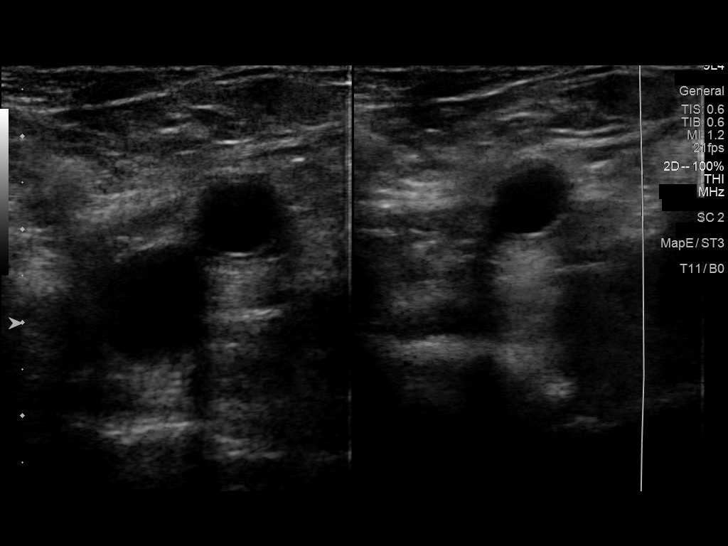
[im 6/33]
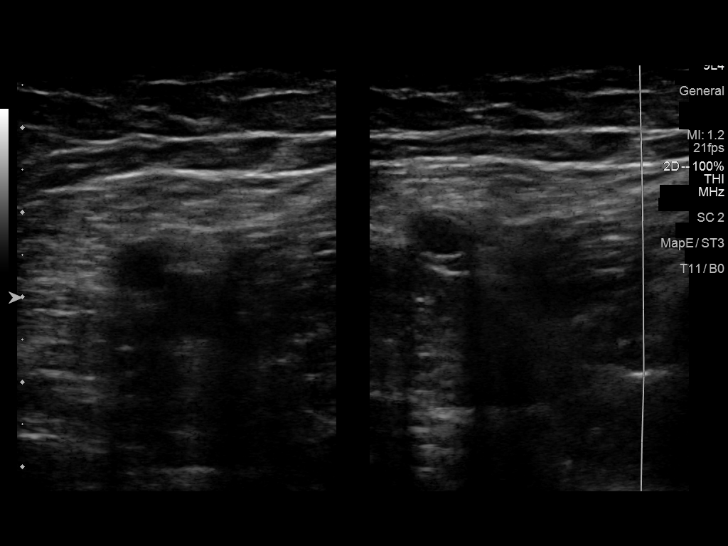
[im 9/33]
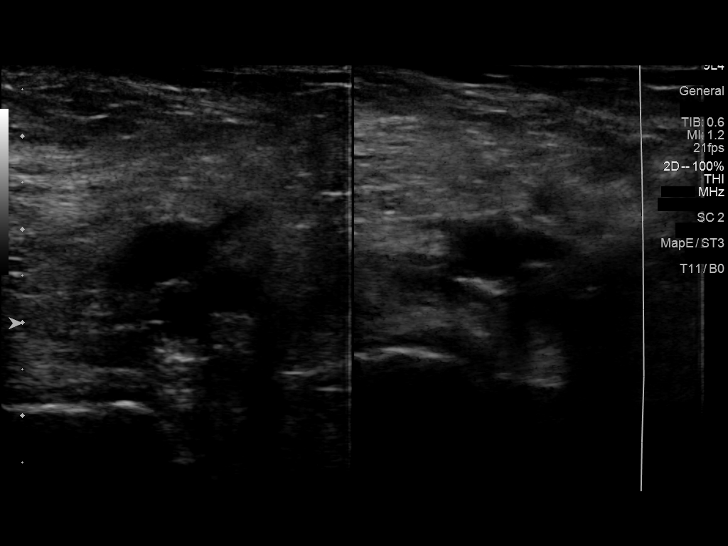
[im 12/33]
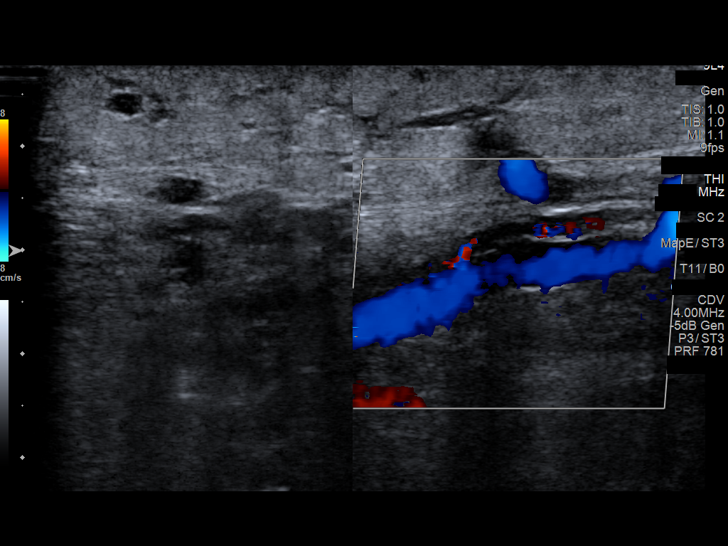
[im 14/33]
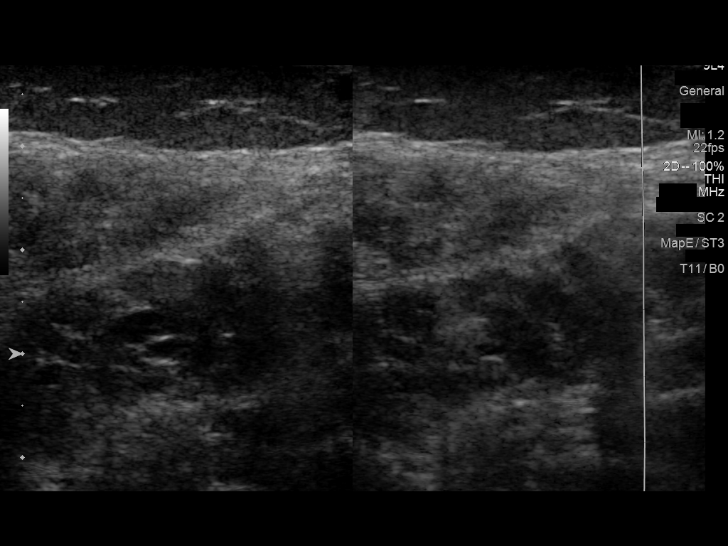
[im 17/33]
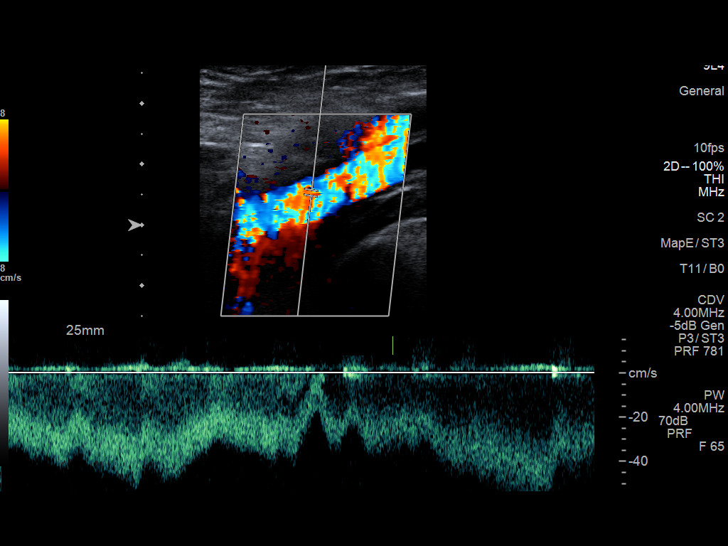
[im 19/33]
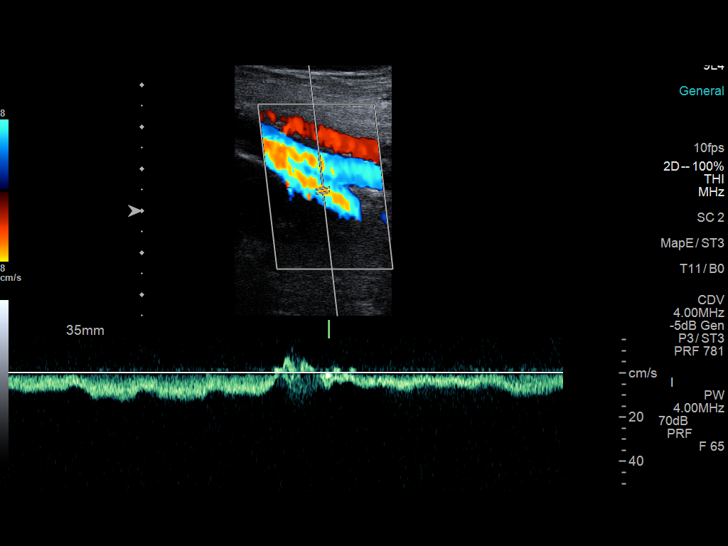
[im 21/33]
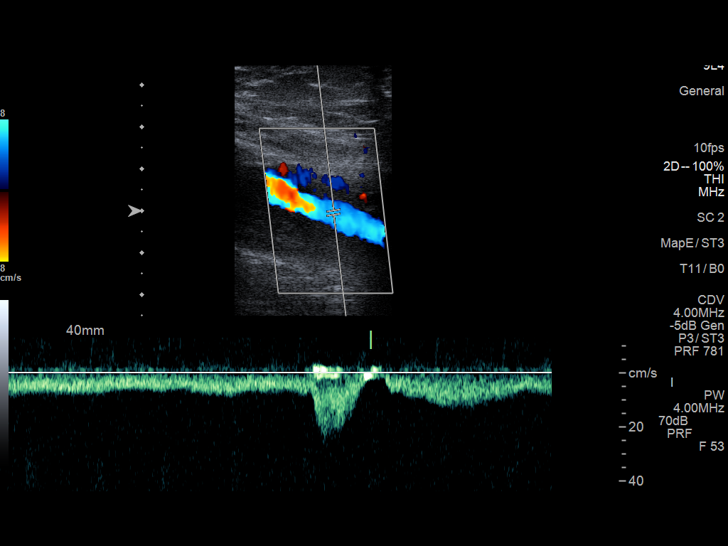
[im 24/33]
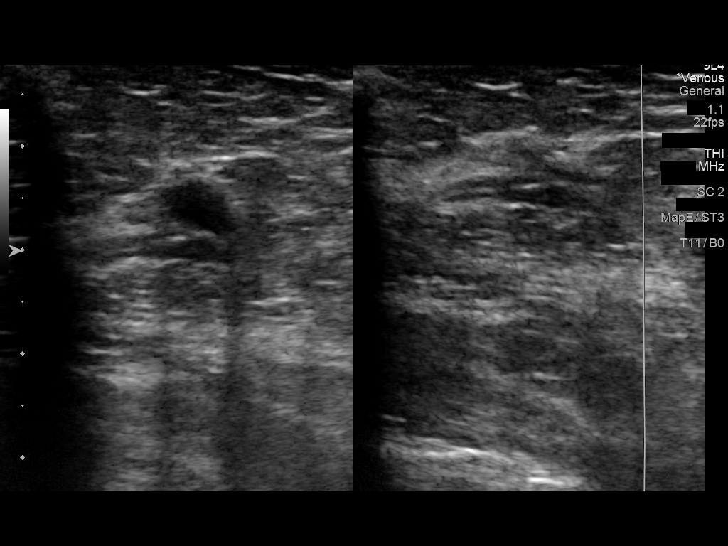
[im 27/33]
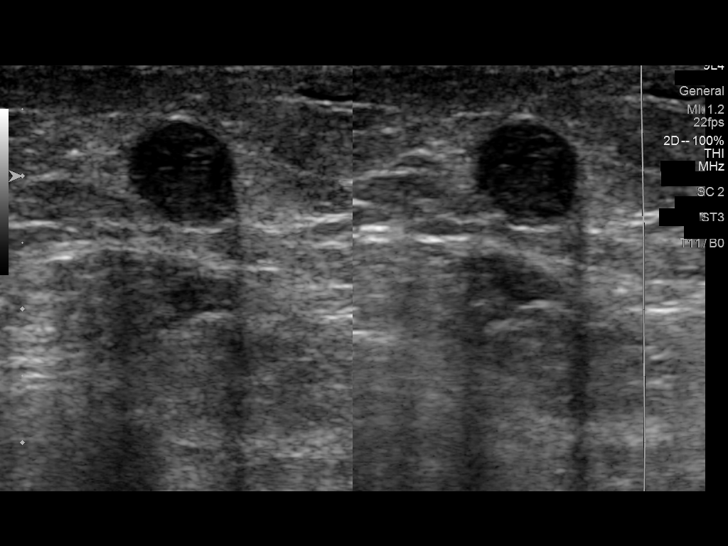
[im 30/33]
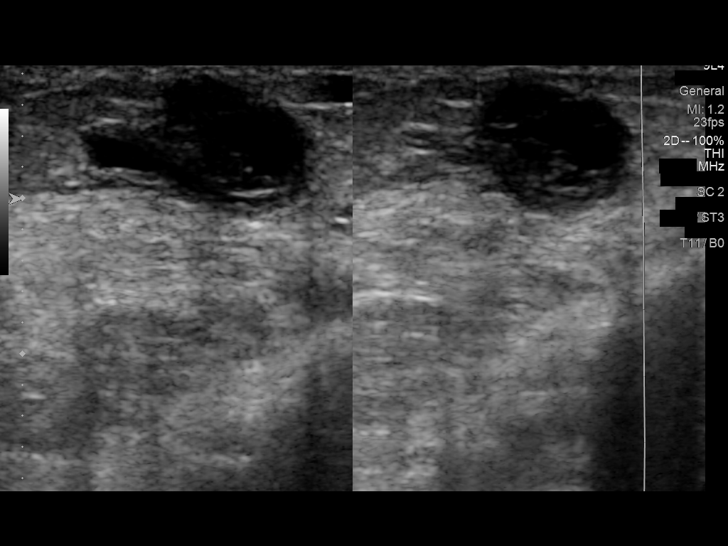
[im 33/33]
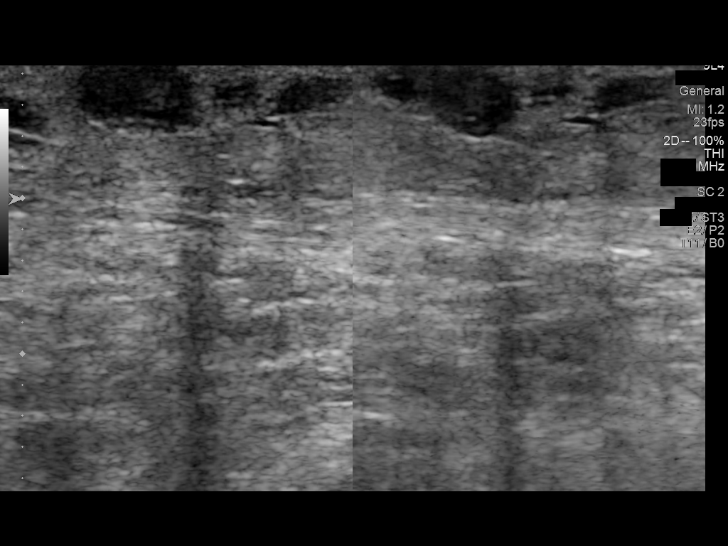

[13 of 24 positions shown; findings below may reference images not displayed]

FINDINGS: Contralateral Common Femoral Vein: Respiratory phasicity is normal
and symmetric with the symptomatic side. No evidence of thrombus.
Normal compressibility.

Common Femoral Vein: No evidence of thrombus. Normal
compressibility, respiratory phasicity and response to augmentation.

Saphenofemoral Junction: No evidence of thrombus. Normal
compressibility and flow on color Doppler imaging.

Profunda Femoral Vein: No evidence of thrombus. Normal
compressibility and flow on color Doppler imaging.

Femoral Vein: No evidence of thrombus. Normal compressibility,
respiratory phasicity and response to augmentation.

Popliteal Vein: No evidence of thrombus. Normal compressibility,
respiratory phasicity and response to augmentation.

Calf Veins: No evidence of thrombus. Normal compressibility and flow
on color Doppler imaging.

Superficial Great Saphenous Vein: Similar superficial thrombosis of
the left GSV beginning at the knee extending to the ankle with some
areas of thrombosed varicosities as before. Little interval change.

Venous Reflux:  Not assessed

Other Findings:  None.
IMPRESSION: Negative for left lower extremity DVT. Similar left distal GSV
superficial thrombosis/thrombophlebitis.

## 2022-04-18 DIAGNOSIS — L308 Other specified dermatitis: Secondary | ICD-10-CM | POA: Diagnosis not present

## 2022-04-18 DIAGNOSIS — L570 Actinic keratosis: Secondary | ICD-10-CM | POA: Diagnosis not present

## 2022-04-21 ENCOUNTER — Institutional Professional Consult (permissible substitution): Payer: Medicare PPO | Admitting: Internal Medicine

## 2022-05-04 DIAGNOSIS — K136 Irritative hyperplasia of oral mucosa: Secondary | ICD-10-CM | POA: Diagnosis not present

## 2022-05-04 DIAGNOSIS — D3701 Neoplasm of uncertain behavior of lip: Secondary | ICD-10-CM | POA: Diagnosis not present

## 2022-05-04 DIAGNOSIS — D485 Neoplasm of uncertain behavior of skin: Secondary | ICD-10-CM | POA: Diagnosis not present

## 2022-05-04 DIAGNOSIS — L308 Other specified dermatitis: Secondary | ICD-10-CM | POA: Diagnosis not present

## 2022-05-04 DIAGNOSIS — L820 Inflamed seborrheic keratosis: Secondary | ICD-10-CM | POA: Diagnosis not present

## 2022-05-04 DIAGNOSIS — C44320 Squamous cell carcinoma of skin of unspecified parts of face: Secondary | ICD-10-CM | POA: Diagnosis not present

## 2022-05-16 DIAGNOSIS — X32XXXA Exposure to sunlight, initial encounter: Secondary | ICD-10-CM | POA: Diagnosis not present

## 2022-05-16 DIAGNOSIS — L308 Other specified dermatitis: Secondary | ICD-10-CM | POA: Diagnosis not present

## 2022-05-16 DIAGNOSIS — C44329 Squamous cell carcinoma of skin of other parts of face: Secondary | ICD-10-CM | POA: Diagnosis not present

## 2022-05-16 DIAGNOSIS — L570 Actinic keratosis: Secondary | ICD-10-CM | POA: Diagnosis not present

## 2022-05-16 DIAGNOSIS — L821 Other seborrheic keratosis: Secondary | ICD-10-CM | POA: Diagnosis not present

## 2022-05-17 ENCOUNTER — Institutional Professional Consult (permissible substitution): Payer: Medicare PPO | Admitting: Internal Medicine

## 2022-05-19 DIAGNOSIS — I700 Atherosclerosis of aorta: Secondary | ICD-10-CM | POA: Diagnosis not present

## 2022-05-19 DIAGNOSIS — Z79899 Other long term (current) drug therapy: Secondary | ICD-10-CM | POA: Diagnosis not present

## 2022-05-29 DIAGNOSIS — L905 Scar conditions and fibrosis of skin: Secondary | ICD-10-CM | POA: Diagnosis not present

## 2022-05-29 DIAGNOSIS — L570 Actinic keratosis: Secondary | ICD-10-CM | POA: Diagnosis not present

## 2022-05-29 DIAGNOSIS — L308 Other specified dermatitis: Secondary | ICD-10-CM | POA: Diagnosis not present

## 2022-05-29 DIAGNOSIS — C434 Malignant melanoma of scalp and neck: Secondary | ICD-10-CM | POA: Diagnosis not present

## 2022-05-29 DIAGNOSIS — D485 Neoplasm of uncertain behavior of skin: Secondary | ICD-10-CM | POA: Diagnosis not present

## 2022-05-31 ENCOUNTER — Other Ambulatory Visit: Payer: Self-pay | Admitting: Neurology

## 2022-06-12 DIAGNOSIS — Z1231 Encounter for screening mammogram for malignant neoplasm of breast: Secondary | ICD-10-CM | POA: Diagnosis not present

## 2022-06-14 ENCOUNTER — Ambulatory Visit: Payer: Medicare PPO | Admitting: Cardiology

## 2022-06-19 ENCOUNTER — Ambulatory Visit: Payer: Medicare PPO | Admitting: Cardiology

## 2022-06-28 DIAGNOSIS — C434 Malignant melanoma of scalp and neck: Secondary | ICD-10-CM | POA: Diagnosis not present

## 2022-07-04 ENCOUNTER — Ambulatory Visit (INDEPENDENT_AMBULATORY_CARE_PROVIDER_SITE_OTHER): Payer: Medicare PPO

## 2022-07-04 ENCOUNTER — Ambulatory Visit: Payer: Medicare PPO | Admitting: Internal Medicine

## 2022-07-04 ENCOUNTER — Encounter: Payer: Self-pay | Admitting: Internal Medicine

## 2022-07-04 DIAGNOSIS — I2510 Atherosclerotic heart disease of native coronary artery without angina pectoris: Secondary | ICD-10-CM

## 2022-07-04 DIAGNOSIS — R55 Syncope and collapse: Secondary | ICD-10-CM

## 2022-07-04 DIAGNOSIS — K5901 Slow transit constipation: Secondary | ICD-10-CM | POA: Diagnosis not present

## 2022-07-04 NOTE — Progress Notes (Signed)
HPI Katie Daniel is referred by Dr. Radford Pax for evaluation of syncope. She is a pleasant 76 yo woman with dyslipidemia and Parkinson's which was diagnosed with in 2014. She describes 2 episodes of altered consciousness. These are associated with no significant warning, and no loss of postural tone. She was able to sit down with one episode and another she was sitting. She was out for only a couple of seconds. She did not lose control of her bladder and did not bite her tongue. No diaphoresis, nausea of HA associated. She has been taken to the ED twice and diagnosed with "dehydration." Associated with her Parkinson's is no tremor or altered facies.  Allergies  Allergen Reactions   Amoxicillin-Pot Clavulanate Other (See Comments)   Latex Other (See Comments)   Penicillins Hives    Did it involve swelling of the face/tongue/throat, SOB, or low BP? No Did it involve sudden or severe rash/hives, skin peeling, or any reaction on the inside of your mouth or nose? Yes Did you need to seek medical attention at a hospital or doctor's office? Yes When did it last happen?      5 years If all above answers are "NO", may proceed with cephalosporin use. Tolerated ancef 03/16/20     Current Outpatient Medications  Medication Sig Dispense Refill   atorvastatin (LIPITOR) 20 MG tablet Take 20 mg by mouth at bedtime.     carbidopa-levodopa (SINEMET IR) 25-100 MG tablet 1.5 tablet at 7 AM/ 1 tablet at 11 AM/3 PM/7 PM 405 tablet 1   Opicapone (ONGENTYS) 50 MG CAPS Take 1 capsule by mouth daily. 90 capsule 1   primidone (MYSOLINE) 50 MG tablet Take 1 tablet (50 mg total) by mouth 2 (two) times daily. 180 tablet 0   rOPINIRole (REQUIP XL) 8 MG 24 hr tablet TAKE 1 TABLET(8 MG) BY MOUTH DAILY 90 tablet 1   No current facility-administered medications for this visit.     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    34.4 on CT 04/2021   Aortic  atherosclerosis (HCC)    noted on chest CT 04/2021   Arthritis    DVT (deep venous thrombosis) (HCC)    Essential tremor    Fam hx-ischem heart disease    Fatigue    Hematoma    OF LEFT CALF   Left arm pain    Osteoarthritis    Osteopenia    Parkinson disease (HCC)    Squamous cell skin cancer    Scalp and side of nose   Vitamin D deficiency     ROS:   All systems reviewed and negative except as noted in the HPI.   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   Procedure: TOTAL KNEE ARTHROPLASTY;  Surgeon: Paralee Cancel, MD;  Location: [facility];  Service: Orthopedics;  Laterality: Right;  70 mins     Family History  Problem Relation Age of Onset   Heart attack Mother        Died of MI at 4   Stroke Father    Heart attack Father        MI in his 35's   Breast cancer Child    CAD Paternal Uncle        MI in 47's  Social History   Socioeconomic History   Marital status: Widowed    Spouse name: Not on file   Number of children: 2   Years of education: 76   Highest education level: Not on file  Occupational History   Occupation: retired  Tobacco Use   Smoking status: Never   Smokeless tobacco: Never  Vaping Use   Vaping Use: Never used  Substance and Sexual Activity   Alcohol use: Yes    Comment: glass of wine occ - 1 time per week   Drug use: Never   Sexual activity: Not on file  Other Topics Concern   Not on file  Social History Narrative   Drinks caffeine   Right handed   One story home   Social Determinants of Health   Financial Resource Strain: Not on file  Food Insecurity: Not on file  Transportation Needs: Not on file  Physical Activity: Not on file  Stress: Not on file  Social Connections: Not on file  Intimate Partner Violence: Not on file     BP 98/62   Pulse 76   Ht 5' 6.5" (1.689 m)   Wt 149 lb  (67.6 kg)   SpO2 95%   BMI 23.69 kg/m   Physical Exam:  Well appearing NAD HEENT: Unremarkable Neck:  No JVD, no thyromegally Lymphatics:  No adenopathy Back:  No CVA tenderness Lungs:  Clear with no wheezes HEART:  Regular rate rhythm, no murmurs, no rubs, no clicks Abd:  soft, positive bowel sounds, no organomegally, no rebound, no guarding Ext:  2 plus pulses, no edema, no cyanosis, no clubbing Skin:  No rashes no nodules Neuro:  CN II through XII intact, motor grossly intact  EKG - nsr  Assess/Plan:  Unexplained syncope - the etiology is unclear though I suspect that it is due to her Parkinson's based on her description. She could have sinus node dysfunction based on her age. I have asked her to undergo a 14 day monitor. If nothing on the monitor we could consider an ILR to prove that this is not sinus node dysfunction and at that point think about Northera.   Carleene Overlie Brealynn Contino,MD

## 2022-07-04 NOTE — Patient Instructions (Addendum)
Medication Instructions:  Your physician recommends that you continue on your current medications as directed. Please refer to the Current Medication list given to you today.  *If you need a refill on your cardiac medications before your next appointment, please call your pharmacy*  Lab Work: None ordered.  If you have labs (blood work) drawn today and your tests are completely normal, you will receive your results only by: [place] (if you have MyChart) OR A paper copy in the mail If you have any lab test that is abnormal or we need to change your treatment, we will call you to review the results.  Testing/Procedures: You will be mailed and wear a 14 day Zio Monitor   Follow-Up: At [facility], you and your health needs are our priority.  As part of our continuing mission to provide you with exceptional heart care, we have created designated Provider Care Teams.  These Care Teams include your primary Cardiologist (physician) and Advanced Practice Providers (APPs -  Physician Assistants and Nurse Practitioners) who all work together to provide you with the care you need, when you need it.  We recommend signing up for the patient portal called "MyChart".  Sign up information is provided on this After Visit Summary.  MyChart is used to connect with patients for Virtual Visits (Telemedicine).  Patients are able to view lab/test results, encounter notes, upcoming appointments, etc.  Non-urgent messages can be sent to your provider as well.   To learn more about what you can do with MyChart, go to [email or web address].    Your next appointment:   Follow up will be scheduled after Zio monitor results.   The format for your next appointment:   In Person  Provider:   Cristopher Peru, MD{or one of the following Advanced Practice Providers on your designated Care Team:   Tommye Standard, [place] Legrand Como "Jonni Sanger" Chalmers Cater, [place]   Important Information About Sugar      Your physician has  recommended that you wear a Zio monitor.   This monitor is a medical device that records the heart's electrical activity. Doctors most often use these monitors to diagnose arrhythmias. Arrhythmias are problems with the speed or rhythm of the heartbeat. The monitor is a small device applied to your chest. You can wear one while you do your normal daily activities. While wearing this monitor if you have any symptoms to push the button and record what you felt. Once you have worn this monitor for the period of time provider prescribed (Usually 14 days), you will return the monitor device in the postage paid box. Once it is returned they will download the data collected and provide [place] with a report which the provider will then review and we will call you with those results. Important tips:  Avoid showering during the first 24 hours of wearing the monitor. Avoid excessive sweating to help maximize wear time. Do not submerge the device, no hot tubs, and no swimming pools. Keep any lotions or oils away from the patch. After 24 hours you may shower with the patch on. Take brief showers with your back facing the shower head.  Do not remove patch once it has been placed because that will interrupt data and decrease adhesive wear time. Push the button when you have any symptoms and write down what you were feeling. Once you have completed wearing your monitor, remove and place into box which has postage paid and place in your outgoing mailbox.  If for some reason  you have misplaced your box then call our office and we can provide another box and/or mail it off for you.   ZIO XT- Long Term Monitor Instructions  Your physician has requested you wear a ZIO patch monitor for 14 days.  This is a single patch monitor. [employer] supplies one patch monitor per enrollment. Additional stickers are not available. Please do not apply patch if you will be having a Nuclear Stress Test,  Echocardiogram, Cardiac CT, MRI, or  Chest Xray during the period you would be wearing the  monitor. The patch cannot be worn during these tests. You cannot remove and re-apply the  ZIO XT patch monitor.  Your ZIO patch monitor will be mailed 3 day USPS to your address on file. It may take 3-5 days  to receive your monitor after you have been enrolled.  Once you have received your monitor, please review the enclosed instructions. Your monitor  has already been registered assigning a specific monitor serial # to you.  Billing and Patient Assistance Program Information  We have supplied [employer] with any of your insurance information on file for billing purposes. [employer] offers a sliding scale Patient Assistance Program for patients that do not have  insurance, or whose insurance does not completely cover the cost of the ZIO monitor.  You must apply for the Patient Assistance Program to qualify for this discounted rate.  To apply, please call [employer] at [phone], select option 4, select option 2, ask to apply for  Patient Assistance Program. Theodore Demark will ask your household income, and how many people  are in your household. They will quote your out-of-pocket cost based on that information.  [employer] will also be able to set up a 78-month interest-free payment plan if needed.  Applying the monitor   Shave hair from upper left chest.  Hold abrader disc by orange tab. Rub abrader in 40 strokes over the upper left chest as  indicated in your monitor instructions.  Clean area with 4 enclosed alcohol pads. Let dry.  Apply patch as indicated in monitor instructions. Patch will be placed under collarbone on left  side of chest with arrow pointing upward.  Rub patch adhesive wings for 2 minutes. Remove white label marked "1". Remove the white  label marked "2". Rub patch adhesive wings for 2 additional minutes.  While looking in a mirror, press and release button in center of patch. A small green light will  flash 3-4 times. This  will be your only indicator that the monitor has been turned on.  Do not shower for the first 24 hours. You may shower after the first 24 hours.  Press the button if you feel a symptom. You will hear a small click. Record Date, Time and  Symptom in the Patient Logbook.  When you are ready to remove the patch, follow instructions on the last 2 pages of Patient  Logbook. Stick patch monitor onto the last page of Patient Logbook.  Place Patient Logbook in the blue and white box. Use locking tab on box and tape box closed  securely. The blue and white box has prepaid postage on it. Please place it in the mailbox as  soon as possible. Your physician should have your test results approximately 7 days after the  monitor has been [facility]  Call IL[place]at [phone] you have questions regarding  your ZIO XT patch monitor. Call them immediately if you see an orange light blinking  on your  monitor.  If your monitor falls off in less than 4 days, contact our Monitor department at [phone].  If your monitor becomes loose or falls off after 4 days call [employer] at [phone] for  suggestions on securing your monitor

## 2022-07-04 NOTE — Progress Notes (Unsigned)
Enrolled for [employer] to mail a ZIO XT long term holter monitor to the patients address on file.  °Dr. Taylor to read. °

## 2022-07-07 DIAGNOSIS — R55 Syncope and collapse: Secondary | ICD-10-CM

## 2022-07-07 DIAGNOSIS — I2510 Atherosclerotic heart disease of native coronary artery without angina pectoris: Secondary | ICD-10-CM | POA: Diagnosis not present

## 2022-07-17 DIAGNOSIS — L308 Other specified dermatitis: Secondary | ICD-10-CM | POA: Diagnosis not present

## 2022-07-17 DIAGNOSIS — Z48817 Encounter for surgical aftercare following surgery on the skin and subcutaneous tissue: Secondary | ICD-10-CM | POA: Diagnosis not present

## 2022-07-23 ENCOUNTER — Other Ambulatory Visit: Payer: Self-pay | Admitting: Neurology

## 2022-07-26 DIAGNOSIS — Z48817 Encounter for surgical aftercare following surgery on the skin and subcutaneous tissue: Secondary | ICD-10-CM | POA: Diagnosis not present

## 2022-07-26 DIAGNOSIS — I2510 Atherosclerotic heart disease of native coronary artery without angina pectoris: Secondary | ICD-10-CM | POA: Diagnosis not present

## 2022-07-26 DIAGNOSIS — R55 Syncope and collapse: Secondary | ICD-10-CM | POA: Diagnosis not present

## 2022-07-27 ENCOUNTER — Ambulatory Visit: Payer: Medicare PPO | Admitting: Cardiology

## 2022-08-01 ENCOUNTER — Ambulatory Visit: Payer: Medicare PPO | Attending: Cardiology | Admitting: Cardiology

## 2022-08-01 ENCOUNTER — Encounter: Payer: Self-pay | Admitting: Cardiology

## 2022-08-01 VITALS — BP 132/76 | HR 77 | Ht 66.5 in | Wt 149.8 lb

## 2022-08-01 DIAGNOSIS — I700 Atherosclerosis of aorta: Secondary | ICD-10-CM

## 2022-08-01 DIAGNOSIS — R55 Syncope and collapse: Secondary | ICD-10-CM

## 2022-08-01 DIAGNOSIS — E7800 Pure hypercholesterolemia, unspecified: Secondary | ICD-10-CM | POA: Diagnosis not present

## 2022-08-01 DIAGNOSIS — R931 Abnormal findings on diagnostic imaging of heart and coronary circulation: Secondary | ICD-10-CM | POA: Diagnosis not present

## 2022-08-01 NOTE — Patient Instructions (Signed)
Medication Instructions:  Your physician recommends that you continue on your current medications as directed. Please refer to the Current Medication list given to you today.  *If you need a refill on your cardiac medications before your next appointment, please call your pharmacy*  Lab Work: Fasting lipids and ALT in 4 weeks If you have labs (blood work) drawn today and your tests are completely normal, you will receive your results only by: [place] (if you have MyChart) OR A paper copy in the mail If you have any lab test that is abnormal or we need to change your treatment, we will call you to review the results.   Follow-Up: At [facility], you and your health needs are our priority.  As part of our continuing mission to provide you with exceptional heart care, we have created designated Provider Care Teams.  These Care Teams include your primary Cardiologist (physician) and Advanced Practice Providers (APPs -  Physician Assistants and Nurse Practitioners) who all work together to provide you with the care you need, when you need it.  Your next appointment:   1 year(s)  The format for your next appointment:   In Person  Provider:   Fransico Him, MD     Important Information About Sugar

## 2022-08-01 NOTE — Progress Notes (Signed)
Cardiology Consult  Note    Date:  08/01/2022   ID:  Katie Daniel, DOB Sep 01, 1946, MRN [id number]  PCP:  Harlan Stains, MD  Cardiologist:  Fransico Him, MD   Chief Complaint  Patient presents with   Follow-up    Coronary artery calcifications, hyperlipidemia, syncope    History of Present Illness:  Katie Daniel is a 76 y.o. female with a hx of coronary artery calcium with coronary calcium score of 34 on CT in 2022, Parkinsons disease and DVT as well as a strong fm hx of premature CAD. Both her parents had MI's and died in their 18's.     She was seen by neurology back in March 2023 and complained of a syncopal episode that occurred a year ago and then 1 this past December 2022.  This occurred while she was seated and was felt by neuro not to be neurogenic in nature.  She was not orthostatic at that office visit.  She was seen by Dr. Lovena Le with EP and it was felt that likely her syncope was related to her Parkinson's.  She wore a 2-week Zio patch showing normal sinus rhythm with rare and very brief bursts of nonsustained SVT up to 12 seconds and rare PVCs and PACs.  Her average heart rate was 73 bpm.  She is here today for followup and is doing well.  She denies any chest pain or pressure, SOB, DOE, PND, orthopnea, LE edema, dizziness, palpitations or syncope. She is compliant with her meds and is tolerating meds with no SE.  She tells me that she feels tired during the day.  She goes to bed around 10-11pm and wakes up around 7am.  She wakes up about 3 times nightly to go to the bathroom so feels tired during the day.  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    34.4 on CT 04/2021   Aortic atherosclerosis (HCC)    noted on chest CT 04/2021   Arthritis    DVT (deep venous thrombosis) (HCC)    Essential tremor    Fam hx-ischem heart disease    Fatigue    Hematoma    OF LEFT CALF   Left arm pain    Osteoarthritis     Osteopenia    Parkinson disease (HCC)    Squamous cell skin cancer    Scalp and side of nose   Vitamin D deficiency     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   carbidopa-levodopa (SINEMET IR) 25-100 MG tablet TAKE 1&1/2 TABLETS BY MOUTH DAILY AT 7AM. TAKE 1 TABLET AT 11AM, 3PM, AND 7PM   Opicapone (ONGENTYS) 50 MG CAPS Take 1 capsule by mouth daily.   primidone (MYSOLINE) 50 MG tablet Take 1 tablet (50 mg total) by mouth 2 (two) times daily.   rOPINIRole (REQUIP XL) 8 MG 24 hr tablet TAKE 1 TABLET(8 MG) BY MOUTH DAILY    Allergies:   Amoxicillin-pot clavulanate, Latex, and Penicillins   Social History  Socioeconomic History   Marital status: Widowed    Spouse name: Not on file   Number of children: 2   Years of education: 31   Highest education level: Not on file  Occupational History   Occupation: retired  Tobacco Use   Smoking status: Never   Smokeless tobacco: Never  Vaping Use   Vaping Use: Never used  Substance and Sexual Activity   Alcohol use: Yes    Comment: glass of wine occ - 1 time per week   Drug use: Never   Sexual activity: Not on file  Other Topics Concern   Not on file  Social History Narrative   Drinks caffeine   Right handed   One story home   Social Determinants of Health   Financial Resource Strain: Not on file  Food Insecurity: Not on file  Transportation Needs: Not on file  Physical Activity: Not on file  Stress: Not on file  Social Connections: Not on file     Family History:  The patient's family history includes Breast cancer in her  child; CAD in her paternal uncle; Heart attack in her father and mother; Stroke in her father.   ROS:   Please see the history of present illness.    ROS All other systems reviewed and are negative.      No data to display             PHYSICAL EXAM:   VS:  BP 132/76   Pulse 77   Ht 5' 6.5" (1.689 m)   Wt 149 lb 12.8 oz (67.9 kg)   SpO2 99%   BMI 23.82 kg/m    GEN: Well nourished, well developed in no acute distress HEENT: Normal NECK: No JVD; No carotid bruits LYMPHATICS: No lymphadenopathy CARDIAC:RRR, no murmurs, rubs, gallops RESPIRATORY:  Clear to auscultation without rales, wheezing or rhonchi  ABDOMEN: Soft, non-tender, non-distended MUSCULOSKELETAL:  No edema; No deformity  SKIN: Warm and dry NEUROLOGIC:  Alert and oriented x 3 PSYCHIATRIC:  Normal affect  Wt Readings from Last 3 Encounters:  08/01/22 149 lb 12.8 oz (67.9 kg)  07/04/22 149 lb (67.6 kg)  03/02/22 148 lb (67.1 kg)      Studies/Labs Reviewed:   EKG:  EKG is not ordered today. changes  Recent Labs: No results found for requested labs within last 365 days.   Lipid Panel    Component Value Date/Time   CHOL 193 06/09/2021 0849   TRIG 44 06/09/2021 0849   HDL 75 06/09/2021 0849   CHOLHDL 2.6 06/09/2021 0849   LDLCALC 109 (H) 06/09/2021 0849     Additional studies/ records that were reviewed today include:  OV notes from PCP    ASSESSMENT:    1. Agatston coronary artery calcium score less than 100   2. Pure hypercholesterolemia   3. Syncope and collapse   4. Aortic atherosclerosis (HCC)      PLAN:  In order of problems listed above:  1.  Coronary artery calcium -Coronary artery calcium score was 34.4. -she has no anginal symptoms -nuclear stress test in 2009 showed no ischemia -LDL is at goal at 57 on 05/19/2022 -Continue atorvastatin low-dose  2.  Hyperlipidemia -LDL goal less than 70 -I have personally reviewed and interpreted outside labs performed by patient's PCP  which showed LDL 57 HDL 69 05/19/2022.>since these labs she decreased her Atorvastatin to '10mg'$  daily per her request -Continue atorvastatin 10 mg daily with as needed refills -repeat FLP and ALT in  4 weeks  3.  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  Her average heart rate was 73 bpm. -Denies any further syncopal episodes  4.  Aortic atherosclerosis -Continue statin therapy  5.  SVT/PACs/PVCs -she has not had any palpitations so would not treat at this time  Followup with me in 1 year  Medication Adjustments/Labs and Tests Ordered: Current medicines are reviewed at length with the patient today.  Concerns regarding medicines are outlined above.  Medication changes, Labs and Tests ordered today are listed in the Patient Instructions below.  There are no Patient Instructions on file for this visit.   Signed, Fransico Him, MD  08/01/2022 2:02 PM    [facility] [address], [place], [place]  [address] Phone: [phone]; Fax: [phone]

## 2022-08-01 NOTE — Addendum Note (Signed)
Addended by: Antonieta Iba on: 08/01/2022 02:09 PM   Modules accepted: Orders

## 2022-08-02 DIAGNOSIS — D2271 Melanocytic nevi of right lower limb, including hip: Secondary | ICD-10-CM | POA: Diagnosis not present

## 2022-08-02 DIAGNOSIS — D2262 Melanocytic nevi of left upper limb, including shoulder: Secondary | ICD-10-CM | POA: Diagnosis not present

## 2022-08-02 DIAGNOSIS — L820 Inflamed seborrheic keratosis: Secondary | ICD-10-CM | POA: Diagnosis not present

## 2022-08-02 DIAGNOSIS — D225 Melanocytic nevi of trunk: Secondary | ICD-10-CM | POA: Diagnosis not present

## 2022-08-02 DIAGNOSIS — Z789 Other specified health status: Secondary | ICD-10-CM | POA: Diagnosis not present

## 2022-08-02 DIAGNOSIS — D2272 Melanocytic nevi of left lower limb, including hip: Secondary | ICD-10-CM | POA: Diagnosis not present

## 2022-08-02 DIAGNOSIS — R238 Other skin changes: Secondary | ICD-10-CM | POA: Diagnosis not present

## 2022-08-02 DIAGNOSIS — L308 Other specified dermatitis: Secondary | ICD-10-CM | POA: Diagnosis not present

## 2022-08-02 DIAGNOSIS — D485 Neoplasm of uncertain behavior of skin: Secondary | ICD-10-CM | POA: Diagnosis not present

## 2022-08-02 DIAGNOSIS — B078 Other viral warts: Secondary | ICD-10-CM | POA: Diagnosis not present

## 2022-08-11 ENCOUNTER — Other Ambulatory Visit: Payer: Self-pay | Admitting: Neurology

## 2022-08-11 ENCOUNTER — Telehealth: Payer: Self-pay

## 2022-08-11 DIAGNOSIS — G20A1 Parkinson's disease without dyskinesia, without mention of fluctuations: Secondary | ICD-10-CM

## 2022-08-11 DIAGNOSIS — G250 Essential tremor: Secondary | ICD-10-CM

## 2022-08-11 DIAGNOSIS — G20 Parkinson's disease: Secondary | ICD-10-CM

## 2022-08-11 IMAGING — CT CT CARDIAC CORONARY ARTERY CALCIUM SCORE
3 series · 14 of 20 positions shown, 15 images · non-contrast
Comparison: None.

Addendum:
CLINICAL DATA: Cardiovascular Disease Risk stratification

EXAM:
Coronary Calcium Score
TECHNIQUE: A gated, non-contrast computed tomography scan of the heart was
performed using 3mm slice thickness. Axial images were analyzed on a
dedicated workstation. Calcium scoring of the coronary arteries was
performed using the Agatston method.

[Series 2: casc 3.0 bv41 2 bestdiast 74 % · axial · 0.39mm/px · z∈[-206,-132]mm · 4 of 43 slices shown, 5 images]
[im 9/43  vessel]
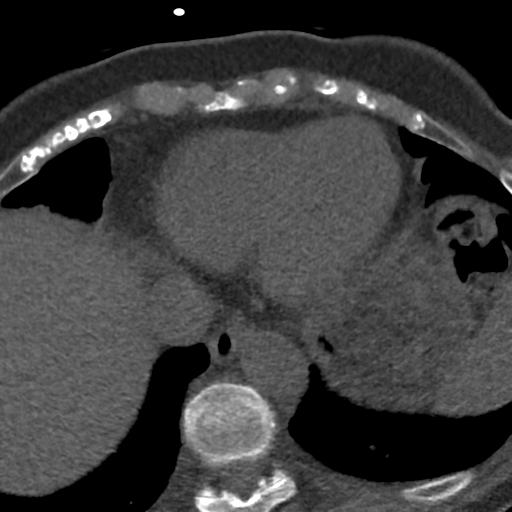
[im 9/43  lung]
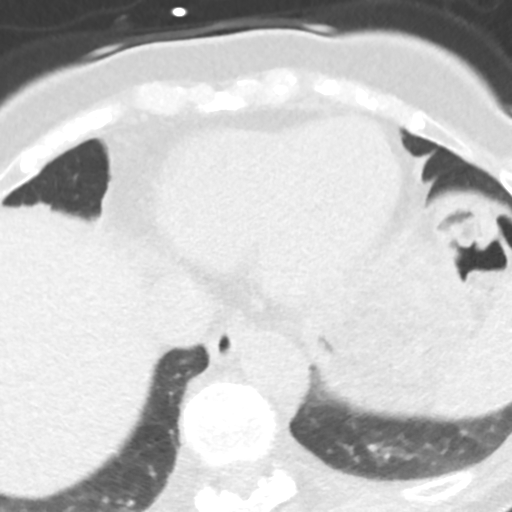
[im 17/43  vessel]
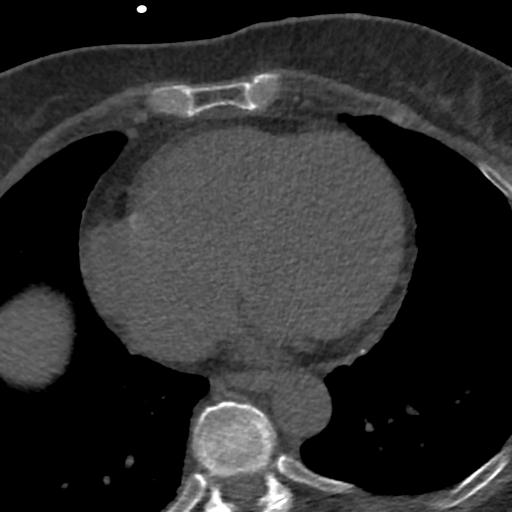
[im 26/43  vessel]
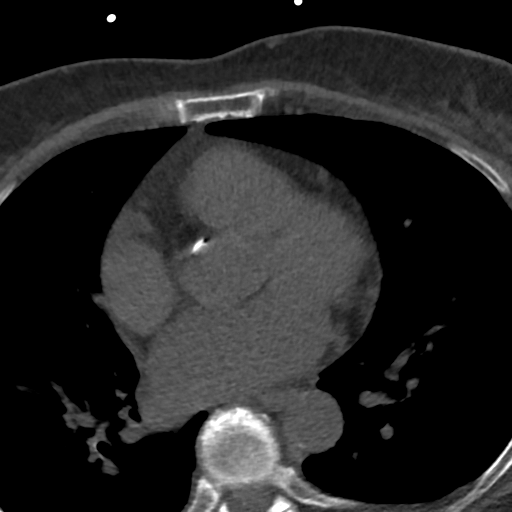
[im 34/43  vessel]
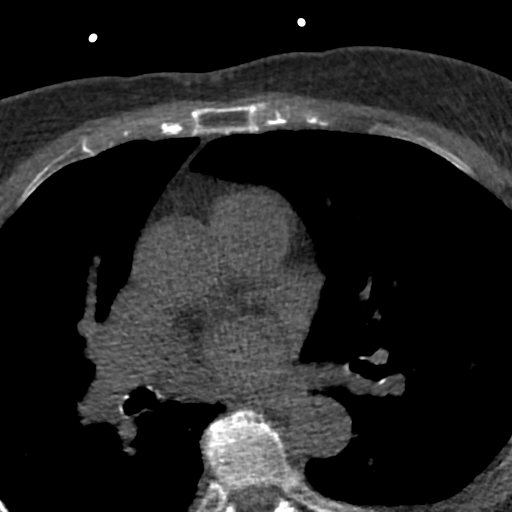

[Series 3: lung 74 % · axial · 0.64mm/px · z∈[-210,-126]mm · 5 of 43 slices shown]
[im 8/43  lung]
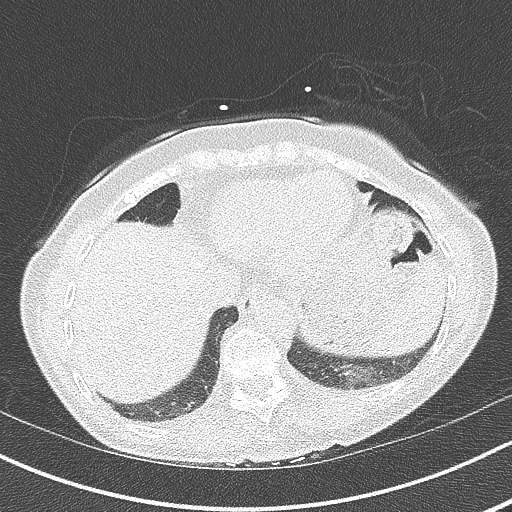
[im 15/43  lung]
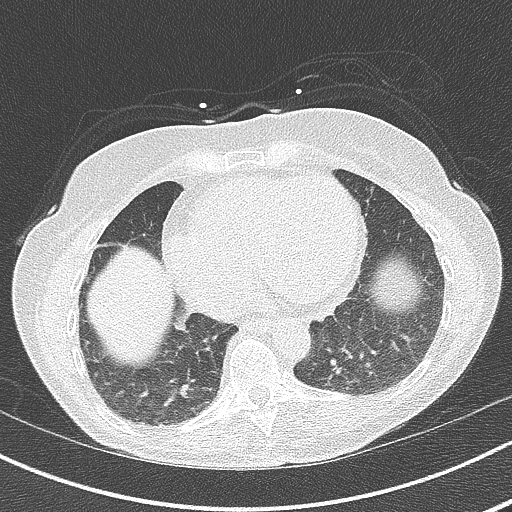
[im 22/43  lung]
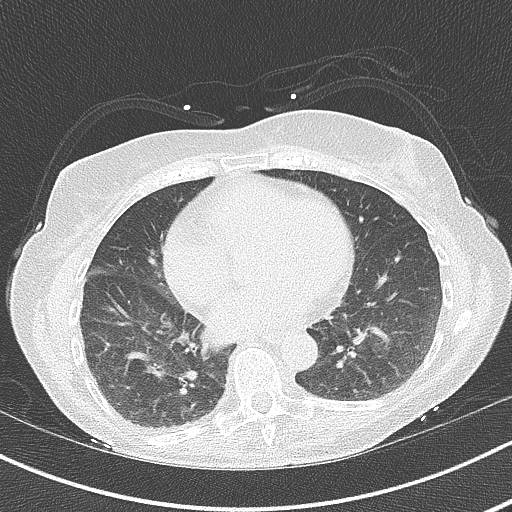
[im 29/43  lung]
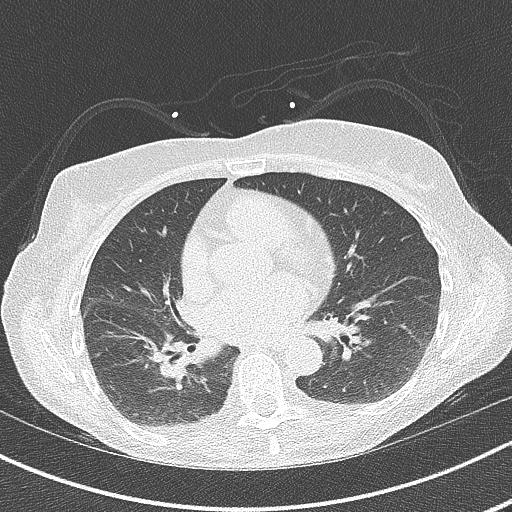
[im 36/43  lung]
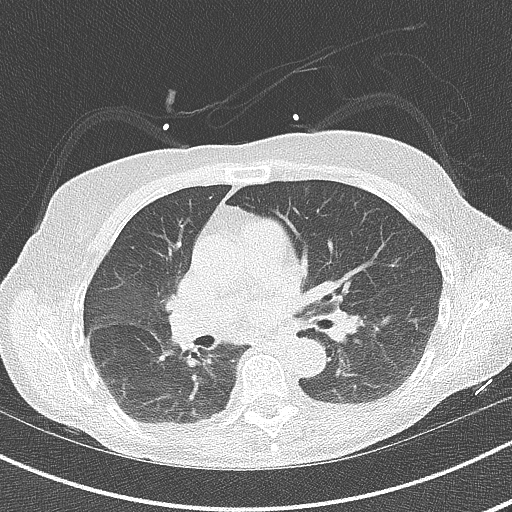

[Series 4: lung st 74 % · axial · 0.64mm/px · z∈[-210,-126]mm · 5 of 43 slices shown]
[im 8/43  lung]
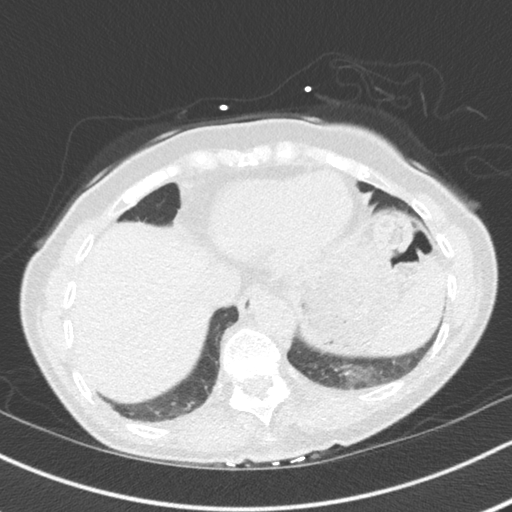
[im 15/43  lung]
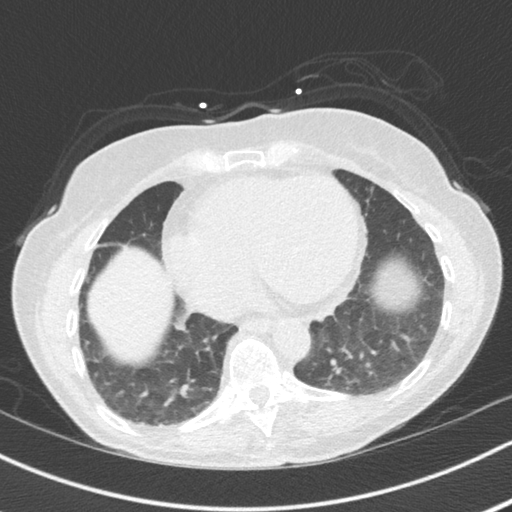
[im 22/43  lung]
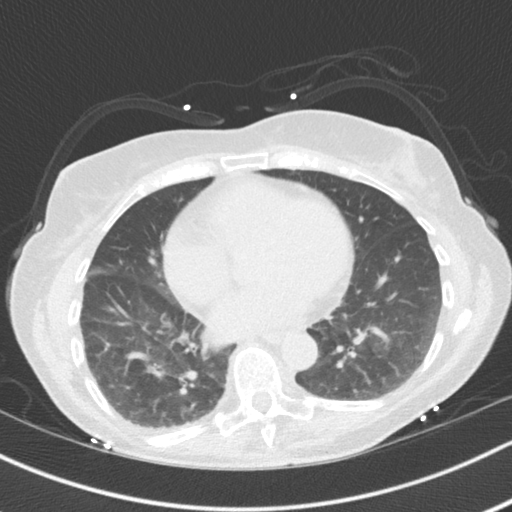
[im 29/43  lung]
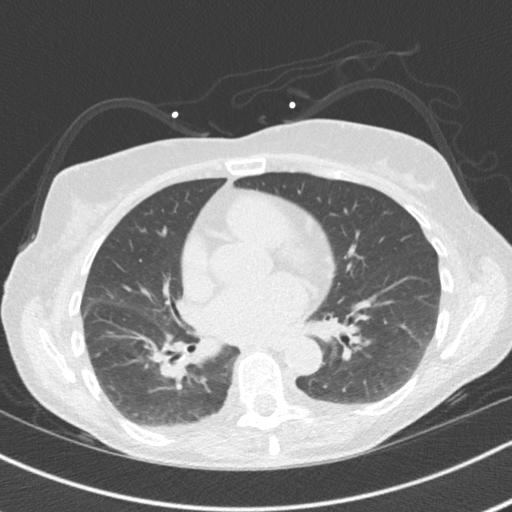
[im 36/43  lung]
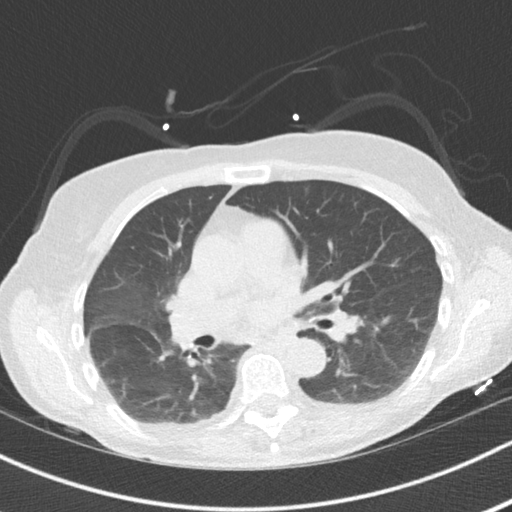

[14 of 20 positions shown; findings below may reference images not displayed]

FINDINGS: Coronary arteries: Normal origins.

Coronary Calcium Score:

Left main: 0

Left anterior descending artery:

Left circumflex artery: 0

Right coronary artery: 0

Total:

Percentile: 45th

Pericardium: Normal.

Ascending Aorta: Normal caliber.  Scattered calcifications.

Non-cardiac: See separate report from [REDACTED].
IMPRESSION: Coronary calcium score of 34.4. This was 45th percentile for age-,
race-, and sex-matched controls.



If CAC=0, it is reasonable to withhold statin therapy and reassess
in 5 to 10 years, as long as higher risk conditions are absent
(diabetes mellitus, family history of premature CHD in first degree
relatives (males <55 years; females <65 years), cigarette smoking,
or LDL >=190 mg/dL).

If CAC is 1 to 99, it is reasonable to initiate statin therapy for
patients >=55 years of age.

If CAC is >=100 or >=75th percentile, it is reasonable to initiate
statin therapy at any age.

Cardiology referral should be considered for patients with CAC
scores >=400 or >=75th percentile.

*5708 AHA/ACC/AACVPR/AAPA/ABC/JIM/BELGRAVE/IMTHIYAAZ/Juzryll/GALAVIZ/BRUBAKER/PRESA
Guideline on the Management of Blood Cholesterol: A Report of the
American College of Cardiology/American Heart Association Task Force
on Clinical Practice Guidelines. J Am Coll Cardiol.
7680;73(24):2787-2893.

EXAM:
OVER-READ INTERPRETATION  CT CHEST

The following report is an over-read performed by radiologist Dr.
over-read does not include interpretation of cardiac or coronary
anatomy or pathology. The coronary calcium score interpretation by
the cardiologist is attached.
FINDINGS: Mild atherosclerosis of the visualized thoracic aorta without
aneurysmal disease. Visualized mediastinum and hilar regions
demonstrate no lymphadenopathy or masses. Visualized lungs show no
evidence of pulmonary edema, consolidation, pneumothorax, nodule or
pleural fluid. Visualized upper abdomen and bony structures are
unremarkable.
IMPRESSION: Aortic atherosclerosis without visualized aneurysmal disease.

*** End of Addendum ***
FINDINGS: Coronary arteries: Normal origins.

Coronary Calcium Score:

Left main: 0

Left anterior descending artery:

Left circumflex artery: 0

Right coronary artery: 0

Total:

Percentile: 45th

Pericardium: Normal.

Ascending Aorta: Normal caliber.  Scattered calcifications.

Non-cardiac: See separate report from [REDACTED].
IMPRESSION: Coronary calcium score of 34.4. This was 45th percentile for age-,
race-, and sex-matched controls.



If CAC=0, it is reasonable to withhold statin therapy and reassess
in 5 to 10 years, as long as higher risk conditions are absent
(diabetes mellitus, family history of premature CHD in first degree
relatives (males <55 years; females <65 years), cigarette smoking,
or LDL >=190 mg/dL).

If CAC is 1 to 99, it is reasonable to initiate statin therapy for
patients >=55 years of age.

If CAC is >=100 or >=75th percentile, it is reasonable to initiate
statin therapy at any age.

Cardiology referral should be considered for patients with CAC
scores >=400 or >=75th percentile.

*5708 AHA/ACC/AACVPR/AAPA/ABC/JIM/BELGRAVE/IMTHIYAAZ/Juzryll/GALAVIZ/BRUBAKER/PRESA
Guideline on the Management of Blood Cholesterol: A Report of the
American College of Cardiology/American Heart Association Task Force
on Clinical Practice Guidelines. J Am Coll Cardiol.
7680;73(24):2787-2893.

## 2022-08-11 NOTE — Telephone Encounter (Signed)
Pt called regarding Dr. Tanna Furry recommendation for Implantable loop recorder.   Pt was educated on the results of her recent long term monitor, and why a loop recorder was advised by Dr. Lovena Le.  Pt stated she is interested in scheduling an appointment to meet with Dr. Lovena Le and discuss it further.  Pt wants to see the loop recorder, and has more questions.  Pt advised to write them down, and someone from [place] will contact her to schedule appointment.

## 2022-08-14 DIAGNOSIS — C434 Malignant melanoma of scalp and neck: Secondary | ICD-10-CM | POA: Diagnosis not present

## 2022-08-15 DIAGNOSIS — M5459 Other low back pain: Secondary | ICD-10-CM | POA: Diagnosis not present

## 2022-08-15 DIAGNOSIS — M5431 Sciatica, right side: Secondary | ICD-10-CM | POA: Diagnosis not present

## 2022-08-21 NOTE — Progress Notes (Addendum)
Assessment/Plan:   1. ET/PD             -Patient initially diagnosed with Parkinson's disease in 2014 by Dr. Maxine Glenn.  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 Nicki Reaper in 2018. 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  Discussed good prognosis with this             -slight increase carbidopa/levodopa 25/100, 1.5 tablets at 7 AM/1 tablet at 11 AM/1.5 tablet at 3 PM (slight increase here)/1 tablet at 7 PM  -continue Opicapone, 50 mg at bedtime.  Discussed purpose of opicapone.  Insurance had previously suggested requip or pramipexole instead (already on requip and pramipexole essentially same).  Will continue on this.               -Continue Requip XL, 8 mg daily.             -Continue primidone, 50 mg twice daily.  We discussed the purpose of this medication today.  Not sure that this is helping, but she has a very significant component of essential tremor on the right particularly and would probably need DBS to fix this part.  -We discussed the new skin biopsies for alpha-synuclein.  Discussed that they have about 95-96% sensitivity and specificity.  Discussed utility of the biopsy.    -discussed things in research pipeline   2.  Syncope  -she was not orthostatic in the office today  -she has had low BP's in the AM's but not convinced that these are Parkinsons Disease related  -her syncope/near syncope occurred while seated twice now (July, 2022 in December 2022) and this is more evidence that its not related to Neurogenic Orthostatic Hypotension   -Zio patch was negative.  Follows with both Dr. Radford Pax and Dr. Lovena Le.  Dr. Lovena Le told her he felt her episodes were from orthostasis, although syncope has occurred while seated.  She is considering a loop recorder and has appt to discuss this.  3.  Scalp melanoma  -status post Mohs surgery at [facility] on June 28, 2022  -She understands that Parkinson's does slightly increase our risk for melanoma.   She will continue to follow with dermatology.  Subjective:   Katie Daniel was seen today in follow up for ET/Parkinsons disease.  My previous records were reviewed prior to todays visit as well as outside records available to me.  We added bedtime CR levodopa last visit to help stutter steps in the middle of the night and first morning on.  She reports that it didn't make a difference (only took 3 dosages and she d/c).  She does state that she is noting some wearing off around 5pm. Last visit, we discussed following back up with cardiology regarding syncope, as I really did not think it was related to neurologic issues.  She has seen cardiology since our last visit.  Notes from Dr. Lovena Le are reviewed from August 1.  Interesting, despite my evaluation, he told her he suspected it was due to her Parkinson's, but did have her undergo a 14-day monitor.  He did tell her he would consider Northera (was not previously orthostatic when we tested it, and I do not see that they did orthostatics, although her blood pressure was low in the office).  Her Zio patch was negative.  She followed back up with Dr. Radford Pax on August 29.  Nothing further was done, as patient had not had further episodes.  Separately, patient saw dermatology at [facility] on July 26 for Mohs surgery for melanoma.  Notes are reviewed.    Current prescribed movement disorder medications:  Carbidopa/levodopa 25/100, 1.5 tablet at 7 AM/ 1 tablet at 11 AM/3 PM/7 PM Carbidopa/levodopa 50/200 at bedtime (started last visit) Requip XL, 8 mg daily  Primidone 50 mg,  1 po bid Opicapone, 50 mg at bedtime    PREVIOUS MEDICATIONS:  amantadine (no help for tremor so d/c); carbidopa/levodopa 25/100; requip xl; primidone; entacapone - given but she didn't take it; opicapone; carbidopa/levodopa 50/200 (she took 3 dosages and d/c - no help for stutter steps at bed)   ALLERGIES:   Allergies  Allergen Reactions   Amoxicillin-Pot Clavulanate Other (See  Comments)   Latex Other (See Comments)   Penicillins Hives    Did it involve swelling of the face/tongue/throat, SOB, or low BP? No Did it involve sudden or severe rash/hives, skin peeling, or any reaction on the inside of your mouth or nose? Yes Did you need to seek medical attention at a hospital or doctor's office? Yes When did it last happen?      5 years If all above answers are "NO", may proceed with cephalosporin use. Tolerated ancef 03/16/20    CURRENT MEDICATIONS:  Outpatient Encounter Medications as of 08/22/2022  Medication Sig   atorvastatin (LIPITOR) 20 MG tablet Take 10 mg by mouth at bedtime.   carbidopa-levodopa (SINEMET IR) 25-100 MG tablet TAKE 1&1/2 TABLETS BY MOUTH DAILY AT 7AM. TAKE 1 TABLET AT 11AM, 3PM, AND 7PM   Opicapone (ONGENTYS) 50 MG CAPS Take 1 capsule by mouth daily.   primidone (MYSOLINE) 50 MG tablet TAKE 1 TABLET(50 MG) BY MOUTH TWICE DAILY   rOPINIRole (REQUIP XL) 8 MG 24 hr tablet TAKE 1 TABLET(8 MG) BY MOUTH DAILY   No facility-administered encounter medications on file as of 08/22/2022.    Objective:   PHYSICAL EXAMINATION:    VITALS:   Vitals:   08/22/22 1257  BP: 121/73  Pulse: 65  SpO2: 99%  Weight: 151 lb 6.4 oz (68.7 kg)    No data found.   GEN:  The patient appears stated age and is in NAD. HEENT:  Normocephalic, atraumatic.  The mucous membranes are moist.  Neurological examination: CV:  RRR Lungs:  CTAB   Orientation: The patient is alert and oriented x3. Cranial nerves: There is good facial symmetry with facial hypomimia. The speech is fluent and clear. Soft palate rises symmetrically and there is no tongue deviation. Hearing is intact to conversational tone. Sensation: Sensation is intact to light touch throughout Motor: Strength is at least antigravity x4.  Movement examination: Tone: There is normal tone in the upper and lower extremities today. Abnormal movements: None today Coordination:  There is no decremation,  with any form of RAMS, including alternating supination and pronation of the forearm, hand opening and closing, finger taps, heel taps and toe taps. Gait and Station: The patient has no difficulty arising out of a deep-seated chair without the use of the hands. 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  Gait is  antalgic.  No shuffling  I have reviewed and interpreted the following labs independently    Chemistry      Component Value Date/Time   NA 141 03/17/2020 0341   K 4.2 03/17/2020 0341   CL 106 03/17/2020 0341   CO2 28 03/17/2020 0341   BUN 16 03/17/2020 0341   CREATININE 0.62 03/17/2020  3491      Component Value Date/Time   CALCIUM 8.7 (L) 03/17/2020 0341   ALT 7 06/09/2021 0849       Lab Results  Component Value Date   WBC 13.2 (H) 03/17/2020   HGB 11.6 (L) 03/17/2020   HCT 37.2 03/17/2020   MCV 95.1 03/17/2020   PLT 221 03/17/2020    No results found for: "TSH"   Total time spent on today's visit was 33 minutes, including both face-to-face time and nonface-to-face time.  Time included that spent on review of records (prior notes available to me/labs/imaging if pertinent), discussing treatment and goals, answering patient's questions and coordinating care.  Cc:  Harlan Stains, MD

## 2022-08-22 ENCOUNTER — Ambulatory Visit: Payer: Medicare PPO | Admitting: Neurology

## 2022-08-22 ENCOUNTER — Encounter: Payer: Self-pay | Admitting: Neurology

## 2022-08-22 DIAGNOSIS — G20 Parkinson's disease: Secondary | ICD-10-CM

## 2022-08-22 DIAGNOSIS — G250 Essential tremor: Secondary | ICD-10-CM

## 2022-08-22 MED ORDER — ROPINIROLE HCL ER 8 MG PO TB24: ORAL_TABLET | ORAL | 1 refills | Status: DC

## 2022-08-22 MED ORDER — ONGENTYS 50 MG PO CAPS: 1.0000 | ORAL_CAPSULE | Freq: Every day | ORAL | 1 refills | Status: DC

## 2022-08-22 MED ORDER — CARBIDOPA-LEVODOPA 25-100 MG PO TABS
ORAL_TABLET | ORAL | 1 refills | Status: DC
Start: 2022-08-22 — End: 2022-10-11

## 2022-08-22 NOTE — Patient Instructions (Signed)
1.5 tablets at 7 AM/1 tablet at 11 AM/1.5 tablet at 3 PM/1 tablet at 7 PM

## 2022-08-24 DIAGNOSIS — R238 Other skin changes: Secondary | ICD-10-CM | POA: Diagnosis not present

## 2022-08-24 DIAGNOSIS — Z6824 Body mass index (BMI) 24.0-24.9, adult: Secondary | ICD-10-CM | POA: Diagnosis not present

## 2022-08-28 DIAGNOSIS — L249 Irritant contact dermatitis, unspecified cause: Secondary | ICD-10-CM | POA: Diagnosis not present

## 2022-08-28 DIAGNOSIS — L821 Other seborrheic keratosis: Secondary | ICD-10-CM | POA: Diagnosis not present

## 2022-08-28 DIAGNOSIS — L308 Other specified dermatitis: Secondary | ICD-10-CM | POA: Diagnosis not present

## 2022-08-30 ENCOUNTER — Other Ambulatory Visit: Payer: Self-pay | Admitting: Cardiology

## 2022-09-01 ENCOUNTER — Ambulatory Visit: Payer: Medicare PPO | Attending: Cardiology

## 2022-09-01 DIAGNOSIS — R55 Syncope and collapse: Secondary | ICD-10-CM | POA: Diagnosis not present

## 2022-09-01 DIAGNOSIS — R931 Abnormal findings on diagnostic imaging of heart and coronary circulation: Secondary | ICD-10-CM

## 2022-09-01 DIAGNOSIS — E7800 Pure hypercholesterolemia, unspecified: Secondary | ICD-10-CM

## 2022-09-01 DIAGNOSIS — I700 Atherosclerosis of aorta: Secondary | ICD-10-CM

## 2022-09-01 LAB — LIPID PANEL
Chol/HDL Ratio: 2.2 ratio (ref 0.0–4.4)
Cholesterol, Total: 167 mg/dL (ref 100–199)
HDL: 76 mg/dL (ref >39)
LDL Chol Calc (NIH): 80 mg/dL (ref 0–99)
Triglycerides: 56 mg/dL (ref 0–149)
VLDL Cholesterol Cal: 11 mg/dL (ref 5–40)

## 2022-09-01 LAB — ALT: ALT: 10 IU/L (ref 0–32)

## 2022-09-04 ENCOUNTER — Telehealth: Payer: Self-pay

## 2022-09-04 DIAGNOSIS — R931 Abnormal findings on diagnostic imaging of heart and coronary circulation: Secondary | ICD-10-CM

## 2022-09-04 DIAGNOSIS — E7800 Pure hypercholesterolemia, unspecified: Secondary | ICD-10-CM

## 2022-09-04 MED ORDER — ATORVASTATIN CALCIUM 20 MG PO TABS: 20.0000 mg | ORAL_TABLET | Freq: Every day | ORAL | 3 refills | Status: AC

## 2022-09-04 NOTE — Telephone Encounter (Signed)
The patient has been notified of the result and verbalized understanding.  All questions (if any) were answered. Katie Iba, RN 09/04/2022 12:10 PM  Patient has been cutting her atorvastatin in half and taking only 10 mg daily.  She states that she would be willing to go back up to 20 mg per day to see if that brings LDL to goal.

## 2022-09-04 NOTE — Telephone Encounter (Signed)
-----   Message from Sueanne Margarita, MD sent at 09/02/2022  8:27 AM EDT ----- LDL not at goal of < 70 - please increase Atorvastatin to '40mg'$  daily and check FLP and ALT in 6 weeks

## 2022-09-05 DIAGNOSIS — M67961 Unspecified disorder of synovium and tendon, right lower leg: Secondary | ICD-10-CM | POA: Diagnosis not present

## 2022-09-05 DIAGNOSIS — M79671 Pain in right foot: Secondary | ICD-10-CM | POA: Diagnosis not present

## 2022-09-13 DIAGNOSIS — M533 Sacrococcygeal disorders, not elsewhere classified: Secondary | ICD-10-CM | POA: Diagnosis not present

## 2022-09-25 DIAGNOSIS — R079 Chest pain, unspecified: Secondary | ICD-10-CM | POA: Diagnosis not present

## 2022-09-25 DIAGNOSIS — G20C Parkinsonism, unspecified: Secondary | ICD-10-CM | POA: Diagnosis not present

## 2022-09-25 DIAGNOSIS — R7989 Other specified abnormal findings of blood chemistry: Secondary | ICD-10-CM | POA: Diagnosis not present

## 2022-09-25 DIAGNOSIS — R0781 Pleurodynia: Secondary | ICD-10-CM | POA: Diagnosis not present

## 2022-09-25 DIAGNOSIS — Z880 Allergy status to penicillin: Secondary | ICD-10-CM | POA: Diagnosis not present

## 2022-09-25 DIAGNOSIS — J90 Pleural effusion, not elsewhere classified: Secondary | ICD-10-CM | POA: Diagnosis not present

## 2022-09-25 DIAGNOSIS — R509 Fever, unspecified: Secondary | ICD-10-CM | POA: Diagnosis not present

## 2022-09-26 DIAGNOSIS — R0781 Pleurodynia: Secondary | ICD-10-CM | POA: Diagnosis not present

## 2022-09-28 ENCOUNTER — Ambulatory Visit: Payer: Medicare PPO | Admitting: Internal Medicine

## 2022-10-11 ENCOUNTER — Other Ambulatory Visit: Payer: Self-pay | Admitting: Neurology

## 2022-10-16 DIAGNOSIS — M25571 Pain in right ankle and joints of right foot: Secondary | ICD-10-CM | POA: Diagnosis not present

## 2022-10-16 DIAGNOSIS — M76829 Posterior tibial tendinitis, unspecified leg: Secondary | ICD-10-CM | POA: Diagnosis not present

## 2022-10-19 ENCOUNTER — Telehealth: Payer: Self-pay | Admitting: Internal Medicine

## 2022-10-19 NOTE — Telephone Encounter (Signed)
   Pt would like to discuss ILR implant to Dr Tanna Furry nurse. She said she have new grand babies she taking care of in a daily basis, she would like to know if that's going to disrupt healing process if she taking care of her grand babies and accidentally hit her chest

## 2022-10-20 DIAGNOSIS — M7911 Myalgia of mastication muscle: Secondary | ICD-10-CM | POA: Diagnosis not present

## 2022-10-20 DIAGNOSIS — M5416 Radiculopathy, lumbar region: Secondary | ICD-10-CM | POA: Diagnosis not present

## 2022-10-23 DIAGNOSIS — M76829 Posterior tibial tendinitis, unspecified leg: Secondary | ICD-10-CM | POA: Diagnosis not present

## 2022-10-23 DIAGNOSIS — M25571 Pain in right ankle and joints of right foot: Secondary | ICD-10-CM | POA: Diagnosis not present

## 2022-10-23 NOTE — Telephone Encounter (Signed)
Pt called back regarding question with ILR Implant.   Pt stated that she cares for her [place] children. They are very active, and sometimes throw their heads back into chest.  Pt is concerned that their head striking her chest could damage the device.  Pt educated on where the loop recorder is placed, and the decreased likely hood of the device being damaged in this way.  Pt still seemed unconvinced that this was the correct choice for her with caring for small children.   Pt also stated that she has been asymptomatic since seeing Dr. Lovena Le and has been doing well. Pt educated on how our providers investigate health concerns, gather data, and initiate a care plan.  Pt appreciated the conversation, and told if she does not want a loop recorder implant at this time, to reach out to [employer] on [address] for returning / worsening / or new symptoms.  Pt understood and stated she would.  Pt was not interested in a ILR at this time.

## 2022-10-30 DIAGNOSIS — X32XXXA Exposure to sunlight, initial encounter: Secondary | ICD-10-CM | POA: Diagnosis not present

## 2022-10-30 DIAGNOSIS — D225 Melanocytic nevi of trunk: Secondary | ICD-10-CM | POA: Diagnosis not present

## 2022-10-30 DIAGNOSIS — M25571 Pain in right ankle and joints of right foot: Secondary | ICD-10-CM | POA: Diagnosis not present

## 2022-10-30 DIAGNOSIS — L570 Actinic keratosis: Secondary | ICD-10-CM | POA: Diagnosis not present

## 2022-10-30 DIAGNOSIS — L308 Other specified dermatitis: Secondary | ICD-10-CM | POA: Diagnosis not present

## 2022-10-30 DIAGNOSIS — C44612 Basal cell carcinoma of skin of right upper limb, including shoulder: Secondary | ICD-10-CM | POA: Diagnosis not present

## 2022-10-30 DIAGNOSIS — L821 Other seborrheic keratosis: Secondary | ICD-10-CM | POA: Diagnosis not present

## 2022-10-30 DIAGNOSIS — D485 Neoplasm of uncertain behavior of skin: Secondary | ICD-10-CM | POA: Diagnosis not present

## 2022-10-30 DIAGNOSIS — D2262 Melanocytic nevi of left upper limb, including shoulder: Secondary | ICD-10-CM | POA: Diagnosis not present

## 2022-10-30 DIAGNOSIS — M76829 Posterior tibial tendinitis, unspecified leg: Secondary | ICD-10-CM | POA: Diagnosis not present

## 2022-10-30 DIAGNOSIS — D2271 Melanocytic nevi of right lower limb, including hip: Secondary | ICD-10-CM | POA: Diagnosis not present

## 2022-10-30 DIAGNOSIS — L814 Other melanin hyperpigmentation: Secondary | ICD-10-CM | POA: Diagnosis not present

## 2022-11-02 DIAGNOSIS — M76829 Posterior tibial tendinitis, unspecified leg: Secondary | ICD-10-CM | POA: Diagnosis not present

## 2022-11-02 DIAGNOSIS — M25571 Pain in right ankle and joints of right foot: Secondary | ICD-10-CM | POA: Diagnosis not present

## 2022-11-03 ENCOUNTER — Other Ambulatory Visit: Payer: Medicare PPO

## 2022-11-06 DIAGNOSIS — M25571 Pain in right ankle and joints of right foot: Secondary | ICD-10-CM | POA: Diagnosis not present

## 2022-11-06 DIAGNOSIS — H11441 Conjunctival cysts, right eye: Secondary | ICD-10-CM | POA: Diagnosis not present

## 2022-11-06 DIAGNOSIS — M76829 Posterior tibial tendinitis, unspecified leg: Secondary | ICD-10-CM | POA: Diagnosis not present

## 2022-11-08 ENCOUNTER — Ambulatory Visit: Payer: Medicare PPO | Attending: Cardiology

## 2022-11-08 ENCOUNTER — Telehealth: Payer: Self-pay

## 2022-11-08 DIAGNOSIS — E7800 Pure hypercholesterolemia, unspecified: Secondary | ICD-10-CM | POA: Diagnosis not present

## 2022-11-08 DIAGNOSIS — R931 Abnormal findings on diagnostic imaging of heart and coronary circulation: Secondary | ICD-10-CM | POA: Diagnosis not present

## 2022-11-08 DIAGNOSIS — M5416 Radiculopathy, lumbar region: Secondary | ICD-10-CM | POA: Diagnosis not present

## 2022-11-08 LAB — LIPID PANEL
Chol/HDL Ratio: 2.2 ratio (ref 0.0–4.4)
Cholesterol, Total: 156 mg/dL (ref 100–199)
HDL: 72 mg/dL (ref >39)
LDL Chol Calc (NIH): 75 mg/dL (ref 0–99)
Triglycerides: 40 mg/dL (ref 0–149)
VLDL Cholesterol Cal: 9 mg/dL (ref 5–40)

## 2022-11-08 LAB — ALT: ALT: 10 IU/L (ref 0–32)

## 2022-11-08 NOTE — Telephone Encounter (Signed)
The patient has been notified of the result and verbalized understanding.  All questions (if any) were answered. Bernestine Amass, RN 11/08/2022 4:56 PM  Patient states that she did increase her atorvastatin back up to 20 mg about two weeks ago. Repeat labs have been scheduled.

## 2022-11-08 NOTE — Telephone Encounter (Signed)
-----   Message from Sueanne Margarita, MD sent at 11/08/2022  4:42 PM EST ----- LDL goal < 70 so needs to increase Atorvastatin back to '20mg'$  daily and repeat FLP and ALT in 6 wees

## 2022-11-08 NOTE — Progress Notes (Signed)
LDL goal < 70 so needs to increase Atorvastatin back to '20mg'$  daily and repeat FLP and ALT in 6 wees

## 2022-11-10 DIAGNOSIS — M76829 Posterior tibial tendinitis, unspecified leg: Secondary | ICD-10-CM | POA: Diagnosis not present

## 2022-11-10 DIAGNOSIS — M25571 Pain in right ankle and joints of right foot: Secondary | ICD-10-CM | POA: Diagnosis not present

## 2022-11-13 DIAGNOSIS — M25571 Pain in right ankle and joints of right foot: Secondary | ICD-10-CM | POA: Diagnosis not present

## 2022-11-13 DIAGNOSIS — M76829 Posterior tibial tendinitis, unspecified leg: Secondary | ICD-10-CM | POA: Diagnosis not present

## 2022-11-14 ENCOUNTER — Other Ambulatory Visit: Payer: Self-pay | Admitting: Neurology

## 2022-11-14 DIAGNOSIS — G20A1 Parkinson's disease without dyskinesia, without mention of fluctuations: Secondary | ICD-10-CM

## 2022-11-14 DIAGNOSIS — G250 Essential tremor: Secondary | ICD-10-CM

## 2022-11-16 DIAGNOSIS — M25571 Pain in right ankle and joints of right foot: Secondary | ICD-10-CM | POA: Diagnosis not present

## 2022-11-16 DIAGNOSIS — M76829 Posterior tibial tendinitis, unspecified leg: Secondary | ICD-10-CM | POA: Diagnosis not present

## 2022-11-21 DIAGNOSIS — M76829 Posterior tibial tendinitis, unspecified leg: Secondary | ICD-10-CM | POA: Diagnosis not present

## 2022-11-21 DIAGNOSIS — M25571 Pain in right ankle and joints of right foot: Secondary | ICD-10-CM | POA: Diagnosis not present

## 2022-11-22 DIAGNOSIS — M25561 Pain in right knee: Secondary | ICD-10-CM | POA: Diagnosis not present

## 2022-11-22 DIAGNOSIS — Z96651 Presence of right artificial knee joint: Secondary | ICD-10-CM | POA: Diagnosis not present

## 2022-11-23 DIAGNOSIS — M25571 Pain in right ankle and joints of right foot: Secondary | ICD-10-CM | POA: Diagnosis not present

## 2022-11-23 DIAGNOSIS — M76829 Posterior tibial tendinitis, unspecified leg: Secondary | ICD-10-CM | POA: Diagnosis not present

## 2022-11-29 DIAGNOSIS — M76829 Posterior tibial tendinitis, unspecified leg: Secondary | ICD-10-CM | POA: Diagnosis not present

## 2022-11-29 DIAGNOSIS — M25571 Pain in right ankle and joints of right foot: Secondary | ICD-10-CM | POA: Diagnosis not present

## 2022-12-06 DIAGNOSIS — M76829 Posterior tibial tendinitis, unspecified leg: Secondary | ICD-10-CM | POA: Diagnosis not present

## 2022-12-06 DIAGNOSIS — M25571 Pain in right ankle and joints of right foot: Secondary | ICD-10-CM | POA: Diagnosis not present

## 2022-12-08 DIAGNOSIS — M76829 Posterior tibial tendinitis, unspecified leg: Secondary | ICD-10-CM | POA: Diagnosis not present

## 2022-12-08 DIAGNOSIS — M25571 Pain in right ankle and joints of right foot: Secondary | ICD-10-CM | POA: Diagnosis not present

## 2022-12-11 DIAGNOSIS — M5416 Radiculopathy, lumbar region: Secondary | ICD-10-CM | POA: Diagnosis not present

## 2022-12-11 DIAGNOSIS — M25571 Pain in right ankle and joints of right foot: Secondary | ICD-10-CM | POA: Diagnosis not present

## 2022-12-11 DIAGNOSIS — M76829 Posterior tibial tendinitis, unspecified leg: Secondary | ICD-10-CM | POA: Diagnosis not present

## 2022-12-28 DIAGNOSIS — M76829 Posterior tibial tendinitis, unspecified leg: Secondary | ICD-10-CM | POA: Diagnosis not present

## 2022-12-28 DIAGNOSIS — M25571 Pain in right ankle and joints of right foot: Secondary | ICD-10-CM | POA: Diagnosis not present

## 2022-12-29 DIAGNOSIS — M76829 Posterior tibial tendinitis, unspecified leg: Secondary | ICD-10-CM | POA: Diagnosis not present

## 2022-12-29 DIAGNOSIS — M25571 Pain in right ankle and joints of right foot: Secondary | ICD-10-CM | POA: Diagnosis not present

## 2023-01-03 DIAGNOSIS — C44612 Basal cell carcinoma of skin of right upper limb, including shoulder: Secondary | ICD-10-CM | POA: Diagnosis not present

## 2023-01-03 DIAGNOSIS — I959 Hypotension, unspecified: Secondary | ICD-10-CM | POA: Diagnosis not present

## 2023-01-05 ENCOUNTER — Other Ambulatory Visit: Payer: Self-pay

## 2023-01-05 DIAGNOSIS — I2510 Atherosclerotic heart disease of native coronary artery without angina pectoris: Secondary | ICD-10-CM

## 2023-01-17 ENCOUNTER — Other Ambulatory Visit: Payer: Medicare PPO

## 2023-01-17 ENCOUNTER — Ambulatory Visit: Payer: Medicare PPO | Attending: Cardiology

## 2023-01-17 DIAGNOSIS — I2510 Atherosclerotic heart disease of native coronary artery without angina pectoris: Secondary | ICD-10-CM | POA: Diagnosis not present

## 2023-01-17 LAB — LIPID PANEL
Chol/HDL Ratio: 2 ratio (ref 0.0–4.4)
Cholesterol, Total: 149 mg/dL (ref 100–199)
HDL: 73 mg/dL (ref >39)
LDL Chol Calc (NIH): 65 mg/dL (ref 0–99)
Triglycerides: 48 mg/dL (ref 0–149)
VLDL Cholesterol Cal: 11 mg/dL (ref 5–40)

## 2023-01-17 LAB — ALT: ALT: 8 IU/L (ref 0–32)

## 2023-02-14 DIAGNOSIS — Z789 Other specified health status: Secondary | ICD-10-CM | POA: Diagnosis not present

## 2023-02-14 DIAGNOSIS — D2262 Melanocytic nevi of left upper limb, including shoulder: Secondary | ICD-10-CM | POA: Diagnosis not present

## 2023-02-14 DIAGNOSIS — D485 Neoplasm of uncertain behavior of skin: Secondary | ICD-10-CM | POA: Diagnosis not present

## 2023-02-14 DIAGNOSIS — L538 Other specified erythematous conditions: Secondary | ICD-10-CM | POA: Diagnosis not present

## 2023-02-14 DIAGNOSIS — D0439 Carcinoma in situ of skin of other parts of face: Secondary | ICD-10-CM | POA: Diagnosis not present

## 2023-02-14 DIAGNOSIS — L820 Inflamed seborrheic keratosis: Secondary | ICD-10-CM | POA: Diagnosis not present

## 2023-02-14 DIAGNOSIS — D225 Melanocytic nevi of trunk: Secondary | ICD-10-CM | POA: Diagnosis not present

## 2023-02-14 DIAGNOSIS — L308 Other specified dermatitis: Secondary | ICD-10-CM | POA: Diagnosis not present

## 2023-02-14 DIAGNOSIS — D2271 Melanocytic nevi of right lower limb, including hip: Secondary | ICD-10-CM | POA: Diagnosis not present

## 2023-02-14 DIAGNOSIS — L821 Other seborrheic keratosis: Secondary | ICD-10-CM | POA: Diagnosis not present

## 2023-02-22 DIAGNOSIS — E785 Hyperlipidemia, unspecified: Secondary | ICD-10-CM | POA: Diagnosis not present

## 2023-02-22 DIAGNOSIS — M8588 Other specified disorders of bone density and structure, other site: Secondary | ICD-10-CM | POA: Diagnosis not present

## 2023-02-22 DIAGNOSIS — G20A1 Parkinson's disease without dyskinesia, without mention of fluctuations: Secondary | ICD-10-CM | POA: Diagnosis not present

## 2023-02-22 DIAGNOSIS — I700 Atherosclerosis of aorta: Secondary | ICD-10-CM | POA: Diagnosis not present

## 2023-02-22 DIAGNOSIS — R591 Generalized enlarged lymph nodes: Secondary | ICD-10-CM | POA: Diagnosis not present

## 2023-02-22 DIAGNOSIS — Z0000 Encounter for general adult medical examination without abnormal findings: Secondary | ICD-10-CM | POA: Diagnosis not present

## 2023-02-22 DIAGNOSIS — G250 Essential tremor: Secondary | ICD-10-CM | POA: Diagnosis not present

## 2023-02-22 DIAGNOSIS — K5900 Constipation, unspecified: Secondary | ICD-10-CM | POA: Diagnosis not present

## 2023-02-22 DIAGNOSIS — E559 Vitamin D deficiency, unspecified: Secondary | ICD-10-CM | POA: Diagnosis not present

## 2023-02-27 DIAGNOSIS — L820 Inflamed seborrheic keratosis: Secondary | ICD-10-CM | POA: Diagnosis not present

## 2023-03-01 DIAGNOSIS — R599 Enlarged lymph nodes, unspecified: Secondary | ICD-10-CM | POA: Diagnosis not present

## 2023-03-01 DIAGNOSIS — R896 Abnormal cytological findings in specimens from other organs, systems and tissues: Secondary | ICD-10-CM | POA: Diagnosis not present

## 2023-03-01 DIAGNOSIS — R897 Abnormal histological findings in specimens from other organs, systems and tissues: Secondary | ICD-10-CM | POA: Diagnosis not present

## 2023-03-01 DIAGNOSIS — R590 Localized enlarged lymph nodes: Secondary | ICD-10-CM | POA: Diagnosis not present

## 2023-03-06 NOTE — Progress Notes (Unsigned)
Assessment/Plan:   1. ET/PD             -Patient initially diagnosed with Parkinson's disease in 2014 by Dr. Maxine Glenn.  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 Nicki Reaper in 2018. 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  Discussed good prognosis with this             -*** carbidopa/levodopa 25/100, 1.5 tablets at 7 AM/1 tablet at 11 AM/1.5 tablet at 3 PM/1 tablet at 7 PM  -continue Opicapone, 50 mg at bedtime.  Discussed purpose of opicapone.                -Continue Requip XL, 8 mg daily.             -Continue primidone, 50 mg twice daily.  We discussed the purpose of this medication today.  Not sure that this is helping, but she has a very significant component of essential tremor on the right particularly and would probably need DBS to fix this part.  -We discussed the new skin biopsies for alpha-synuclein.  Discussed that they have about 95-96% sensitivity and specificity.  Discussed utility of the biopsy.    -discussed things in research pipeline   2.  Syncope  -she was not orthostatic in the office today  -she has had low BP's in the AM's but not convinced that these are Parkinsons Disease related  -her syncope/near syncope occurred while seated twice now (July, 2022 in December 2022) and this is more evidence that its not related to Neurogenic Orthostatic Hypotension   -Zio patch was negative.  Follows with both Dr. Radford Pax and Dr. Lovena Le.  Dr. Lovena Le told her he felt her episodes were from orthostasis, although syncope has occurred while seated.  She is considering a loop recorder and has appt to discuss this.  3.  Scalp melanoma  -status post Mohs surgery at [facility] on June 28, 2022  -She understands that Parkinson's does slightly increase our risk for melanoma.  She will continue to follow with dermatology.  Subjective:   Katie Daniel was seen today in follow up for ET/Parkinsons disease.  My previous records were reviewed prior to  todays visit as well as outside records available to me.  We just slightly increased her 3 PM dose of levodopa last visit because she was complaining about wearing off.  She reports today ***.  She continues on ropinirole, without evidence of compulsive behaviors or sleep attacks.    Current prescribed movement disorder medications: carbidopa/levodopa 25/100, 1.5 tablets at 7 AM/1 tablet at 11 AM/1.5 tablet at 3 PM/1 tablet at 7 PM (slightly increased 3 PM dose last visit) Carbidopa/levodopa 50/200 at bedtime (started last visit) Requip XL, 8 mg daily  Primidone 50 mg,  1 po bid Opicapone, 50 mg at bedtime    PREVIOUS MEDICATIONS:  amantadine (no help for tremor so d/c); carbidopa/levodopa 25/100; requip xl; primidone; entacapone - given but she didn't take it; opicapone; carbidopa/levodopa 50/200 (she took 3 dosages and d/c - no help for stutter steps at bed)   ALLERGIES:   Allergies  Allergen Reactions   Amoxicillin-Pot Clavulanate Other (See Comments)   Latex Other (See Comments)   Penicillins Hives    Did it involve swelling of the face/tongue/throat, SOB, or low BP? No Did it involve sudden or severe rash/hives, skin peeling, or any reaction on the inside of your mouth or nose? Yes Did you need to  seek medical attention at a hospital or doctor's office? Yes When did it last happen?      5 years If all above answers are "NO", may proceed with cephalosporin use. Tolerated ancef 03/16/20    CURRENT MEDICATIONS:  Outpatient Encounter Medications as of 08/22/2022  Medication Sig   atorvastatin (LIPITOR) 20 MG tablet Take 10 mg by mouth at bedtime.   carbidopa-levodopa (SINEMET IR) 25-100 MG tablet TAKE 1&1/2 TABLETS BY MOUTH DAILY AT 7AM. TAKE 1 TABLET AT 11AM, 3PM, AND 7PM   Opicapone (ONGENTYS) 50 MG CAPS Take 1 capsule by mouth daily.   primidone (MYSOLINE) 50 MG tablet TAKE 1 TABLET(50 MG) BY MOUTH TWICE DAILY   rOPINIRole (REQUIP XL) 8 MG 24 hr tablet TAKE 1 TABLET(8 MG) BY  MOUTH DAILY   No facility-administered encounter medications on file as of 08/22/2022.    Objective:   PHYSICAL EXAMINATION:    VITALS:   Vitals:   08/22/22 1257  BP: 121/73  Pulse: 65  SpO2: 99%  Weight: 151 lb 6.4 oz (68.7 kg)    No data found.   GEN:  The patient appears stated age and is in NAD. HEENT:  Normocephalic, atraumatic.  The mucous membranes are moist.  Neurological examination: CV:  RRR Lungs:  CTAB   Orientation: The patient is alert and oriented x3. Cranial nerves: There is good facial symmetry with facial hypomimia. The speech is fluent and clear. Soft palate rises symmetrically and there is no tongue deviation. Hearing is intact to conversational tone. Sensation: Sensation is intact to light touch throughout Motor: Strength is at least antigravity x4.  Movement examination: Tone: There is normal tone in the upper and lower extremities today. Abnormal movements: None today Coordination:  There is no decremation, with any form of RAMS, including alternating supination and pronation of the forearm, hand opening and closing, finger taps, heel taps and toe taps. Gait and Station: The patient has no difficulty arising out of a deep-seated chair without the use of the hands. 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  Gait is  antalgic.  No shuffling  I have reviewed and interpreted the following labs independently    Chemistry      Component Value Date/Time   NA 141 03/17/2020 0341   K 4.2 03/17/2020 0341   CL 106 03/17/2020 0341   CO2 28 03/17/2020 0341   BUN 16 03/17/2020 0341   CREATININE 0.62 03/17/2020 0341      Component Value Date/Time   CALCIUM 8.7 (L) 03/17/2020 0341   ALT 7 06/09/2021 0849       Lab Results  Component Value Date   WBC 13.2 (H) 03/17/2020   HGB 11.6 (L) 03/17/2020   HCT 37.2 03/17/2020   MCV 95.1 03/17/2020   PLT 221 03/17/2020    No results found for: "TSH"   Total time  spent on today's visit was *** minutes, including both face-to-face time and nonface-to-face time.  Time included that spent on review of records (prior notes available to me/labs/imaging if pertinent), discussing treatment and goals, answering patient's questions and coordinating care.  Cc:  Harlan Stains, MD

## 2023-03-07 ENCOUNTER — Encounter: Payer: Self-pay | Admitting: Neurology

## 2023-03-07 ENCOUNTER — Ambulatory Visit: Payer: Medicare PPO | Admitting: Neurology

## 2023-03-07 VITALS — BP 116/72 | HR 73 | Ht 66.0 in | Wt 143.4 lb

## 2023-03-07 DIAGNOSIS — G20A1 Parkinson's disease without dyskinesia, without mention of fluctuations: Secondary | ICD-10-CM | POA: Diagnosis not present

## 2023-03-07 DIAGNOSIS — G250 Essential tremor: Secondary | ICD-10-CM

## 2023-03-07 MED ORDER — ROPINIROLE HCL ER 8 MG PO TB24: ORAL_TABLET | ORAL | 1 refills | Status: DC

## 2023-03-07 MED ORDER — PRIMIDONE 50 MG PO TABS: ORAL_TABLET | ORAL | 1 refills | Status: DC

## 2023-03-07 MED ORDER — ONGENTYS 50 MG PO CAPS: 1.0000 | ORAL_CAPSULE | Freq: Every day | ORAL | 1 refills | Status: DC

## 2023-03-07 MED ORDER — CARBIDOPA-LEVODOPA 25-100 MG PO TABS: ORAL_TABLET | ORAL | 1 refills | Status: DC

## 2023-03-07 NOTE — Patient Instructions (Signed)
Local and Online Resources for Power over Parkinson's Group  April 2024   LOCAL [place] PARKINSON'S GROUPS   Power over Parkinson's Group:    Power Over Parkinson's Patient Education Group will be Wednesday, April 10th-*Hybrid meting*- in person at [facility] location and via [facility], 2:00-3:00 pm.   Power over [employer] and Care Partner Groups will meet together, with plans for separate break out session for caregivers, depending on topic/speaker Upcoming Power over Parkinson's Meetings/Care Partner Support:  2nd Wednesdays of the month at 2 pm:   April 10th, May 8th [place] at [email or web address] if interested in participating in this group    Liberal OFFERINGS  NEW:  Parkinson's Social Game Night.  First Thursday of each month, 2:00-4:00 pm.  *Next date is April 4th*.  [place], [employer].  Contact [email or web address] if interested. Parkinson's CarePartner Group for Men is in the works, if interested email [email or web address] ACT FITNESS Chair Yoga classes "Train and Gain", Fridays 10 am, ACT Fitness.  Contact Gina at [phone].  Mining engineer Classes offering at [employer]!  TUESDAYS (Chair Yoga)  and Wednesdays (PWR! Moves)  1:00 pm.   Contact Vonna Kotyk at  [email or web address]  or [phone]  Drumming for Parkinson's will be held on 2nd and 4th Mondays at 11:00 am.   Located at the [place] ([place].)  Contact Doylene Canning at [email or web address] or [phone]  Dance for Parkinson 's classes will be on Tuesdays 10-11 am. Located in the [employer] , in the first floor of the [employer] ([place].) To register:  [email or web address] or [place] Class, Mondays at 11 am.  Call [phone] for  details Hamil-Kerr Challenge.  Bike, Run, [employer] for [employer].  Saturday, April 6th at [facility].  To register, visit [email or web address] Moving Day [facility].  Saturday, May 4th, 10 am start.  Register at [employer].[place]:  [email or web address]  PD Health at Home continues:  Mindfulness Mondays, Wellness Wednesdays, Fitness Fridays  (PWR! Moves as part of Fitness Fridays March 22nd, 1-1:45 pm) Upcoming Education:   Parkinson's 101.  Wednesday, April 3rd, 1-2 pm Movement for Parkinson's.  Wednesday, May 1st, 1-2 pm Expert Briefing:  Research Update:  Working to [employer] PD.  Wednesday, April 10th, 1-2 pm Trouble with Zzz's:  Sleep Challenges with Parkinson's.  Wed, May 8th 1-2 pm Register for virtual education and expert briefings (webinars) at [email or web address] Please check out their website to sign up for emails and see their full online offerings     [place]:  [email or web address]   Third Thursday Webinars:  On the third Thursday of every month at 12 p.m. ET, join our free live webinars to learn about various aspects of living with Parkinson's disease and our work to speed medical breakthroughs.  Upcoming Webinar:  Let's Talk Taboos:  Hard-to-Discuss Parkinson's Symptoms.  Thursday, April 18th at 12 noon. Check out additional information on their website to see their full online offerings    [facility]:  [email or web address]  Upcoming Webinar:   Emergent Therapies.  Wednesday, April 2nd, 4 pm Series:  Living with Parkinson's Meetup.   Third Thursdays each month, 3 pm  Care Partner Monthly Meetup.  With Robin Searing Phinney.  First Tuesday of each month, 2 pm  Check out additional  information to Live Well Today on their website    Parkinson and Movement Disorders (PMD) Alliance:  www.[email or web address]  NeuroLife  Online:  Online Education Events  Sign up for emails, which are sent weekly to give you updates on programming and online offerings    Parkinson's Association of the [place]:  www.[email or web address]  Information on online support groups, education events, and online exercises including Yoga, Parkinson's exercises and more-LOTS of information on links to PD resources and online events  Virtual Support Group through Parkinson's Association of the [place]; next one is scheduled for Wednesday, April 3rd  MOVEMENT AND EXERCISE OPPORTUNITIES  PWR! Moves Classes at [place].  Wednesdays 10 and 11 am.   Contact Amy Marriott, PT [email or web address] if interested.  Parkinson's Exercise Class offerings at [employer]. *TUESDAYS* (Chair yoga) and Wednesdays (PWR! Moves)  1:00 pm.    Contact Vonna Kotyk at [email or web address]    Parkinson's Wellness Recovery (PWR! Moves)  [email or web address]  Info on the PWR! Virtual Experience:  You will have access to our expertise?through self-assessment, guided plans that start with the PD-specific fundamentals, educational content, tips, Q&A with an expert, and a growing Art therapist of PD-specific pre-recorded and live exercise classes of varying types and intensity - both physical and cognitive! If that is not enough, we offer 1:1 wellness consultations (in-person or virtual) to personalize your PWR! Research scientist (medical).   [place] Fridays:   As part of the PD Health @ Home program, this free video series focuses each week on one aspect of fitness designed to support people living with Parkinson's.? These weekly videos highlight the [employer] fitness guidelines for people with Parkinson's disease.  ModemGamers.si  Dance for PD website is offering free, live-stream classes throughout the week, as well as links to [employer] of classes:   [email or web address]  Virtual dance and Pilates for Parkinson's classes: Click on the Community Tab> Parkinson's Movement Initiative Tab.  To register for classes and for more information, visit [email or web address] and click the "community" tab.   [employer] Parkinson's Cycling Classes   [employer]:  Thursdays @ Noon-Live classes at [employer] ([employer] at [email or web address]?or [phone])  [employer]: Classes Tuesday, Wednesday and Thursday (contact [place] at [email or web address] ?or [phone])  [place]  Varied levels of classes are offered Tuesdays and Thursdays at [employer].   Stretching with Verdis Frederickson weekly class is also offered for people with Parkinson's  To observe a class or for more information, call [phone] or email Hezzie Bump at [email or web address]   ADDITIONAL SUPPORT AND RESOURCES  [employer]:  Online Caregiver Education Opportunities:  www.well-[email or web address]/caregiver-education/caregiver-support-group.  You may also contact Vickki Muff at [email or web address] or [phone].     [employer] April May Decisions Day:  The Most Critical Legal and Medical Decisions to Consider Now!  Tuesday, April 16th, 1-3 pm at [facility].  Contact [facility] at [email or web address] or [phone] Powerful Tools for Caregivers.  6 week educational series for caregivers.  April 18-May 23, 10:30 am-12:15 pm at [facility] 3rd Floor Conference Room.   Contact 04-07-1994 at [email or web address] or [phone] to register Well-Spring Navigator:  Just1Navigator program, a?free service to help individuals and families through the journey of determining care for older adults.  The "Navigator" is a [facility], 02-03-1990, who will speak with a prospective client and/or loved  ones to provide an assessment of the situation and a set of recommendations for a personalized care  plan -- all free of charge, and whether?[employer] offers the needed service or not. If the need is not a service we provide, we are well-connected with reputable programs in town that we can refer you to.  [email or web address] or to speak with the Navigator, call [phone].

## 2023-04-19 DIAGNOSIS — L538 Other specified erythematous conditions: Secondary | ICD-10-CM | POA: Diagnosis not present

## 2023-04-19 DIAGNOSIS — L820 Inflamed seborrheic keratosis: Secondary | ICD-10-CM | POA: Diagnosis not present

## 2023-05-02 ENCOUNTER — Other Ambulatory Visit: Payer: Self-pay | Admitting: Neurology

## 2023-05-02 DIAGNOSIS — G20A1 Parkinson's disease without dyskinesia, without mention of fluctuations: Secondary | ICD-10-CM

## 2023-05-02 DIAGNOSIS — G250 Essential tremor: Secondary | ICD-10-CM

## 2023-05-07 DIAGNOSIS — C44321 Squamous cell carcinoma of skin of nose: Secondary | ICD-10-CM | POA: Diagnosis not present

## 2023-05-16 DIAGNOSIS — L820 Inflamed seborrheic keratosis: Secondary | ICD-10-CM | POA: Diagnosis not present

## 2023-05-16 DIAGNOSIS — L298 Other pruritus: Secondary | ICD-10-CM | POA: Diagnosis not present

## 2023-05-16 DIAGNOSIS — X32XXXA Exposure to sunlight, initial encounter: Secondary | ICD-10-CM | POA: Diagnosis not present

## 2023-05-16 DIAGNOSIS — L821 Other seborrheic keratosis: Secondary | ICD-10-CM | POA: Diagnosis not present

## 2023-05-16 DIAGNOSIS — D2271 Melanocytic nevi of right lower limb, including hip: Secondary | ICD-10-CM | POA: Diagnosis not present

## 2023-05-16 DIAGNOSIS — Z789 Other specified health status: Secondary | ICD-10-CM | POA: Diagnosis not present

## 2023-05-16 DIAGNOSIS — D2262 Melanocytic nevi of left upper limb, including shoulder: Secondary | ICD-10-CM | POA: Diagnosis not present

## 2023-05-16 DIAGNOSIS — L538 Other specified erythematous conditions: Secondary | ICD-10-CM | POA: Diagnosis not present

## 2023-05-16 DIAGNOSIS — D225 Melanocytic nevi of trunk: Secondary | ICD-10-CM | POA: Diagnosis not present

## 2023-06-11 DIAGNOSIS — M533 Sacrococcygeal disorders, not elsewhere classified: Secondary | ICD-10-CM | POA: Diagnosis not present

## 2023-06-20 DIAGNOSIS — M533 Sacrococcygeal disorders, not elsewhere classified: Secondary | ICD-10-CM | POA: Diagnosis not present

## 2023-06-20 DIAGNOSIS — Z1231 Encounter for screening mammogram for malignant neoplasm of breast: Secondary | ICD-10-CM | POA: Diagnosis not present

## 2023-07-05 DIAGNOSIS — R051 Acute cough: Secondary | ICD-10-CM | POA: Diagnosis not present

## 2023-07-05 DIAGNOSIS — U071 COVID-19: Secondary | ICD-10-CM | POA: Diagnosis not present

## 2023-07-17 DIAGNOSIS — M533 Sacrococcygeal disorders, not elsewhere classified: Secondary | ICD-10-CM | POA: Diagnosis not present

## 2023-07-27 ENCOUNTER — Other Ambulatory Visit: Payer: Self-pay

## 2023-07-27 ENCOUNTER — Telehealth: Payer: Self-pay | Admitting: Neurology

## 2023-07-27 DIAGNOSIS — G20A1 Parkinson's disease without dyskinesia, without mention of fluctuations: Secondary | ICD-10-CM

## 2023-07-27 DIAGNOSIS — G250 Essential tremor: Secondary | ICD-10-CM

## 2023-07-27 MED ORDER — ROPINIROLE HCL ER 8 MG PO TB24
ORAL_TABLET | ORAL | 0 refills | Status: DC
Start: 2023-07-27 — End: 2023-09-12

## 2023-07-27 NOTE — Telephone Encounter (Signed)
Patient needs a refill on the Ropinirole and she would like it called into the [facility] in [place]

## 2023-08-15 DIAGNOSIS — D2262 Melanocytic nevi of left upper limb, including shoulder: Secondary | ICD-10-CM | POA: Diagnosis not present

## 2023-08-15 DIAGNOSIS — L820 Inflamed seborrheic keratosis: Secondary | ICD-10-CM | POA: Diagnosis not present

## 2023-08-15 DIAGNOSIS — D1801 Hemangioma of skin and subcutaneous tissue: Secondary | ICD-10-CM | POA: Diagnosis not present

## 2023-08-15 DIAGNOSIS — L538 Other specified erythematous conditions: Secondary | ICD-10-CM | POA: Diagnosis not present

## 2023-08-15 DIAGNOSIS — R208 Other disturbances of skin sensation: Secondary | ICD-10-CM | POA: Diagnosis not present

## 2023-08-15 DIAGNOSIS — Z789 Other specified health status: Secondary | ICD-10-CM | POA: Diagnosis not present

## 2023-08-15 DIAGNOSIS — D2271 Melanocytic nevi of right lower limb, including hip: Secondary | ICD-10-CM | POA: Diagnosis not present

## 2023-08-15 DIAGNOSIS — D225 Melanocytic nevi of trunk: Secondary | ICD-10-CM | POA: Diagnosis not present

## 2023-08-15 DIAGNOSIS — L821 Other seborrheic keratosis: Secondary | ICD-10-CM | POA: Diagnosis not present

## 2023-09-03 ENCOUNTER — Other Ambulatory Visit: Payer: Self-pay | Admitting: Neurology

## 2023-09-03 ENCOUNTER — Telehealth: Payer: Self-pay | Admitting: Neurology

## 2023-09-03 DIAGNOSIS — G20A1 Parkinson's disease without dyskinesia, without mention of fluctuations: Secondary | ICD-10-CM

## 2023-09-03 DIAGNOSIS — G250 Essential tremor: Secondary | ICD-10-CM

## 2023-09-03 MED ORDER — CARBIDOPA-LEVODOPA 25-100 MG PO TABS
ORAL_TABLET | ORAL | 0 refills | Status: DC
Start: 2023-09-03 — End: 2023-09-03

## 2023-09-03 NOTE — Addendum Note (Signed)
Addended by: Ila Mcgill C on: 09/03/2023 12:38 PM   Modules accepted: Orders

## 2023-09-03 NOTE — Addendum Note (Signed)
Addended by: Ila Mcgill C on: 09/03/2023 12:37 PM   Modules accepted: Orders

## 2023-09-03 NOTE — Telephone Encounter (Signed)
1. Which medications need to be refilled? (please list name of each medication and dose if known) carbidopa-levodopa (SINEMET IR) 25-100 MG tablet [[id number]   2. Which pharmacy/location (including street and city if local pharmacy) is medication to be sent to? [facility] [place]  3. Do they need a 30 day or 90 day supply?   Caller stated she has appointment on 10/22/23 but only has just enough medication to last her until 09/11/23. Would like to know if she can get a script for more to last her until next follow up appt.

## 2023-09-04 NOTE — Telephone Encounter (Signed)
Called pharmacy and made sure patients meds were being filled

## 2023-09-10 ENCOUNTER — Other Ambulatory Visit: Payer: Self-pay | Admitting: Neurology

## 2023-09-10 DIAGNOSIS — G250 Essential tremor: Secondary | ICD-10-CM

## 2023-09-10 DIAGNOSIS — G20A1 Parkinson's disease without dyskinesia, without mention of fluctuations: Secondary | ICD-10-CM

## 2023-09-11 ENCOUNTER — Ambulatory Visit: Payer: Medicare PPO | Admitting: Physician Assistant

## 2023-09-12 ENCOUNTER — Other Ambulatory Visit: Payer: Self-pay | Admitting: Neurology

## 2023-09-12 DIAGNOSIS — G250 Essential tremor: Secondary | ICD-10-CM

## 2023-09-12 DIAGNOSIS — G20A1 Parkinson's disease without dyskinesia, without mention of fluctuations: Secondary | ICD-10-CM

## 2023-09-14 ENCOUNTER — Ambulatory Visit: Payer: Medicare PPO | Admitting: Neurology

## 2023-09-18 NOTE — Progress Notes (Unsigned)
Cardiology Office Note    Date:  09/20/2023  ID:  Katie Daniel, DOB [birth year], MRN [id number] PCP:  Laurann Montana, MD  Cardiologist:  Armanda Magic, MD  Electrophysiologist:  None   Chief Complaint: f/u coronary calcification  History of Present Illness: .    Katie Daniel is a 77 y.o. female with visit-pertinent history of coronary calcification by CT, aortic atherosclerosis, Parkinson's disease followed by Dr. Arbutus Leas, remote questionable DVT, syncope, SVT, PACs, PVCs seen for follow-up.   She had a remote DVT about 10 years ago by ultrasound but then had a f/u MRI that showed no blood clot, so she was only on anticoagulation for about 3 weeks (it was transiently continued while she went on an overseas trip). She has not had any recurrent concerns for VTE. She has a strong family history of premature CAD in her parents who had MIs and died in their 71s. Calcium score in 2022 showed CAC 34.4, 45%ile, with aortic atherosclerosis. The patient preferred lower dose statin. She was seen by neurology in March 2023 and complained of a syncopal episode that occurred a year prior and then again in December 2022.  This occurred while she was seated and was felt by neuro not to be neurogenic in nature. She was not orthostatic at that office visit. She was seen by Dr. Ladona Ridgel with EP and it was felt that likely her syncope was related to her Parkinson's. She wore a 2-week Zio patch showing normal sinus rhythm with rare and very brief bursts of nonsustained SVT up to 12 seconds and rare PVCs and PACs, average HR 73 bpm.   Overall she reports she has been doing well. She denies any anginal type of chest pain. (She had brief MSK discomfort while reaching a few months ago lasting a few seconds). She does report generalized daytime fatigue. Her daughter also sent her a video of her snoring that she shared with me today. There is concern for possible underlying sleep apnea and she would like to pursue evaluation.  She reports DOE with higher levels of activity that is unchanged, no concerns which normal activity. She relays this to a decrease in her activity level limited by issues with orthopedic problems in her knee/hip that started a few years ago. She reports her baseline BP is low normal. She does recall an episode several months ago where she didn't feel well and home BP was registering in the 70s briefly. She did not have to seek care for this. This has not recurred.   Labwork independently reviewed: 01/2023 LDL 65, trig 48, ALT wnl, K 3.5, Cr 0.70 09/2022 Hgb 12.5, plt 225 No TSH  ROS: .    Please see the history of present illness.  All other systems are reviewed and otherwise negative.  Studies Reviewed: Marland Kitchen    EKG:  EKG is ordered today, personally reviewed, demonstrating NSR 80bpm, no acute STT changes  CV Studies: Cardiac studies reviewed are outlined and summarized above. Otherwise please see EMR for full report.   Current Reported Medications:.    Current Meds  Medication Sig   Acetaminophen 500 MG capsule Take by mouth.   atorvastatin (LIPITOR) 20 MG tablet Take 1 tablet (20 mg total) by mouth daily.   carbidopa-levodopa (SINEMET IR) 25-100 MG tablet TAKE 1.5 TABLETS BY MOUTH AT 7AM; 1 TABLET AT 11AM; 1.5 TABLET AT 3PM; AND 1 TABLET AT 7 PM.   Opicapone (ONGENTYS) 50 MG CAPS Take 1 capsule by mouth daily.  primidone (MYSOLINE) 50 MG tablet TAKE 1 TABLET(50 MG) BY MOUTH TWICE DAILY   rOPINIRole (REQUIP XL) 8 MG 24 hr tablet TAKE 1 TABLET(8 MG) BY MOUTH DAILY    Physical Exam:    VS:  BP 104/70   Pulse 75   Ht 5\' 6"  (1.676 m)   Wt 147 lb (66.7 kg)   SpO2 95%   BMI 23.73 kg/m    Wt Readings from Last 3 Encounters:  09/20/23 147 lb (66.7 kg)  03/07/23 143 lb 6.4 oz (65 kg)  08/22/22 151 lb 6.4 oz (68.7 kg)    GEN: Well nourished, well developed in no acute distress NECK: No JVD; No carotid bruits CARDIAC: RRR, no murmurs, rubs, gallops RESPIRATORY:  Clear to  auscultation without rales, wheezing or rhonchi  ABDOMEN: Soft, non-tender, non-distended EXTREMITIES:  No edema; No acute deformity   Asessement and Plan:.    1. Coronary/aortic atherosclerosis - no recent anginal symptoms. She has unchanged chronic mild DOE with higher levels of activity. We will obtain a baseline echocardiogram for completeness. She will notify for any worsening symptoms. Does not appear to be on ASA per Dr. Norris Cross prior note. Given age and CAC <100, will hold off adding as benefit ratio is less apparent in this subset of patients. Continue atorvastatin 20mg  daily. We discussed that lipids would be due in 01/2024; she plans to get these obtained when she goes for her physical with PCP around that time. She will have them send [place] copy of results for our review.  2. PSVT, PACs, PVCs - quiescent, low burden on monitor, no acute concerns with this today. Does not presently require any treatment.  3. History of syncope, hypotension - syncope was previously felt due to her Parkinson's disease. She has not had any recurrent syncope. She will notify for any recurrent concerns of low blood pressure.She continues to follow with neurology. We will obtain f/u labs and echo for completeness.  4. Sleep disordered breathing, fatigue - concerning for sleep apnea. Will pursue Itamar sleep study and basic updated labs for generalized fatigue. STOPBANG 4.    Disposition: F/u with Dr. Mayford Knife in 1 year, sooner if testing abnormal.  Signed, Laurann Montana, PA-C

## 2023-09-19 ENCOUNTER — Ambulatory Visit: Payer: Medicare PPO | Admitting: Neurology

## 2023-09-20 ENCOUNTER — Ambulatory Visit: Payer: Medicare PPO | Attending: Physician Assistant | Admitting: Physician Assistant

## 2023-09-20 ENCOUNTER — Telehealth: Payer: Self-pay | Admitting: *Deleted

## 2023-09-20 ENCOUNTER — Telehealth: Payer: Self-pay | Admitting: Physician Assistant

## 2023-09-20 ENCOUNTER — Encounter: Payer: Self-pay | Admitting: Physician Assistant

## 2023-09-20 VITALS — BP 104/70 | HR 75 | Ht 66.0 in | Wt 147.0 lb

## 2023-09-20 DIAGNOSIS — Z87898 Personal history of other specified conditions: Secondary | ICD-10-CM | POA: Diagnosis not present

## 2023-09-20 DIAGNOSIS — G4730 Sleep apnea, unspecified: Secondary | ICD-10-CM | POA: Diagnosis not present

## 2023-09-20 DIAGNOSIS — I959 Hypotension, unspecified: Secondary | ICD-10-CM

## 2023-09-20 DIAGNOSIS — I700 Atherosclerosis of aorta: Secondary | ICD-10-CM | POA: Diagnosis not present

## 2023-09-20 DIAGNOSIS — I493 Ventricular premature depolarization: Secondary | ICD-10-CM | POA: Diagnosis not present

## 2023-09-20 DIAGNOSIS — I2510 Atherosclerotic heart disease of native coronary artery without angina pectoris: Secondary | ICD-10-CM

## 2023-09-20 DIAGNOSIS — R5383 Other fatigue: Secondary | ICD-10-CM

## 2023-09-20 DIAGNOSIS — I491 Atrial premature depolarization: Secondary | ICD-10-CM | POA: Diagnosis not present

## 2023-09-20 DIAGNOSIS — I471 Supraventricular tachycardia, unspecified: Secondary | ICD-10-CM

## 2023-09-20 NOTE — Telephone Encounter (Signed)
Ronie Spies, PAC ordered Itamar.   Patient agreement reviewed and signed on 09/20/2023.  WatchPAT issued to patient on 09/20/2023 by Danielle Rankin, CMA. Patient aware to not open the WatchPAT box until contacted with the activation PIN. Patient profile initialized in CloudPAT on 09/20/2023 by Danielle Rankin, CMA. Device serial number: [id number]  Please list Reason for Call as Advice Only and type "WatchPAT issued to patient" in the comment box.

## 2023-09-20 NOTE — Telephone Encounter (Signed)
Opened in error

## 2023-09-20 NOTE — Patient Instructions (Addendum)
Medication Instructions:  Your physician recommends that you continue on your current medications as directed. Please refer to the Current Medication list given to you today.  *If you need a refill on your cardiac medications before your next appointment, please call your pharmacy*   Lab Work: TODAY:  CMET, MAG, CBC, & TSH  If you have labs (blood work) drawn today and your tests are completely normal, you will receive your results only by: MyChart Message (if you have MyChart) OR A paper copy in the mail If you have any lab test that is abnormal or we need to change your treatment, we will call you to review the results.   Testing/Procedures: Your physician has requested that you have an echocardiogram. Echocardiography is a painless test that uses sound waves to create images of your heart. It provides your doctor with information about the size and shape of your heart and how well your heart's chambers and valves are working. This procedure takes approximately one hour. There are no restrictions for this procedure. Please do NOT wear cologne, perfume, aftershave, or lotions (deodorant is allowed). Please arrive 15 minutes prior to your appointment time.   Your physician has recommended that you have a Itamar sleep study. This test records several body functions during sleep, including: brain activity, eye movement, oxygen and carbon dioxide blood levels, heart rate and rhythm, breathing rate and rhythm, the flow of air through your mouth and nose, snoring, body muscle movements, and chest and belly movement.   Follow-Up: At [facility], you and your health needs are our priority.  As part of our continuing mission to provide you with exceptional heart care, we have created designated Provider Care Teams.  These Care Teams include your primary Cardiologist (physician) and Advanced Practice Providers (APPs -  Physician Assistants and Nurse Practitioners) who all work together to  provide you with the care you need, when you need it.  We recommend signing up for the patient portal called "MyChart".  Sign up information is provided on this After Visit Summary.  MyChart is used to connect with patients for Virtual Visits (Telemedicine).  Patients are able to view lab/test results, encounter notes, upcoming appointments, etc.  Non-urgent messages can be sent to your provider as well.   To learn more about what you can do with MyChart, go to [email or web address].    Your next appointment:   1 year(s)  Provider:   Armanda Magic, MD     Other Instructions

## 2023-09-21 LAB — CBC
Hematocrit: 42.2 % (ref 34.0–46.6)
Hemoglobin: 13.6 g/dL (ref 11.1–15.9)
MCH: 29.6 pg (ref 26.6–33.0)
MCHC: 32.2 g/dL (ref 31.5–35.7)
MCV: 92 fL (ref 79–97)
Platelets: 250 10*3/uL (ref 150–450)
RBC: 4.59 x10E6/uL (ref 3.77–5.28)
RDW: 13.5 % (ref 11.7–15.4)
WBC: 7.8 10*3/uL (ref 3.4–10.8)

## 2023-09-21 LAB — COMPREHENSIVE METABOLIC PANEL
ALT: 8 [IU]/L (ref 0–32)
AST: 19 [IU]/L (ref 0–40)
Albumin: 3.9 g/dL (ref 3.8–4.8)
Alkaline Phosphatase: 102 [IU]/L (ref 44–121)
BUN/Creatinine Ratio: 22 (ref 12–28)
BUN: 17 mg/dL (ref 8–27)
Bilirubin Total: 0.3 mg/dL (ref 0.0–1.2)
CO2: 24 mmol/L (ref 20–29)
Calcium: 9.4 mg/dL (ref 8.7–10.3)
Chloride: 103 mmol/L (ref 96–106)
Creatinine, Ser: 0.79 mg/dL (ref 0.57–1.00)
Globulin, Total: 2.8 g/dL (ref 1.5–4.5)
Glucose: 90 mg/dL (ref 70–99)
Potassium: 4.9 mmol/L (ref 3.5–5.2)
Sodium: 143 mmol/L (ref 134–144)
Total Protein: 6.7 g/dL (ref 6.0–8.5)
eGFR: 77 mL/min/{1.73_m2} (ref >59)

## 2023-09-21 LAB — MAGNESIUM: Magnesium: 2.1 mg/dL (ref 1.6–2.3)

## 2023-09-21 LAB — TSH: TSH: 1.27 u[IU]/mL (ref 0.450–4.500)

## 2023-10-11 DIAGNOSIS — C44321 Squamous cell carcinoma of skin of nose: Secondary | ICD-10-CM | POA: Diagnosis not present

## 2023-10-11 NOTE — Telephone Encounter (Signed)
**Note De-Identified Jeremy Ditullio Obfuscation** Ordering provider: Ronie Spies, PA-c Associated diagnoses: Snoring-Ro6.83 and Fatigue-R53.83 WatchPAT PA obtained on 10/11/2023 by Yatzil Clippinger, Lorelle Formosa, LPN. Per [facility] provider portal: The following codes do not require a submission: Service info-[id number] Sleep study, unattended, simultaneous recording; heart rate, oxygen saturation, respiratory analysis (eg, by airflow or peripheral arterial tone), and sleep tim Patient notified of PIN ([id number]) on 10/11/2023 Romolo Sieling Notification Method: phone -Christena Sunderlin VM(OK per DPR). I also sent the pt a [facility] message with her Pin # as well.  Phone note routed to covering staff for follow-up.

## 2023-10-18 NOTE — Progress Notes (Signed)
Assessment/Plan:   1. ET/PD             -Patient initially diagnosed with Parkinson's disease in 2014 by Dr. Ardyth Man.  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 Lorin Picket in 2018. 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  Discussed good prognosis with this             - carbidopa/levodopa 25/100, 1.5 tablets at 7 AM/1 tablet at 11 AM/1.5 tablet at 3 PM/1 tablet at 7 PM  -continue Opicapone, 50 mg at bedtime.  Discussed purpose of opicapone.                -Continue Requip XL, 8 mg daily.             -Continue primidone, 50 mg twice daily.   Not sure that this is helping, but she has a very significant component of essential tremor on the right particularly and would probably need DBS to fix this part.  -she asks about several different supplements but told her that the supplements (tumeric, neuromag, inflammatone) have not been shown to help Parkinsons Disease.  Discussed diet as relates to Parkinsons Disease and meds.  Handout given.   2.  History of syncope  -her syncope/near syncope occurred while seated twice now (July, 2022 in December 2022) and this is more evidence that its not related to Neurogenic Orthostatic Hypotension.  She has not been orthostatic when tested in the hospital.  -Zio patch was negative.  Follows with both Dr. Mayford Knife and Dr. Ladona Ridgel.  Dr. Ladona Ridgel told her he felt her episodes were from orthostasis, although syncope has occurred while seated.  She is considering a loop recorder   3.  Scalp melanoma  -following with [facility]  -has had several Mohs sx now for [facility]  -She understands that Parkinson's does slightly increase our risk for melanoma.  She will continue to follow with dermatology.  4.  Nocturia  -will let me know if wants referral to urology/urogyn  Subjective:   Katie Daniel was seen today in follow up for ET/Parkinsons disease.  My previous records were reviewed prior to todays visit as well as  outside records available to me.  She is doing well from a Parkinsons Disease standpoint.  No hallucination.  No syncope.  She is exercising but no formal classes (PT exercises at home).  She spends time with her 66 month old grandson.  She is having lots of nocturia x 3-4 times  Current prescribed movement disorder medications: carbidopa/levodopa 25/100, 1.5 tablets at 7 AM/1 tablet at 11 AM/1.5 tablet at 3 PM/1 tablet at 7 PM  Requip XL, 8 mg daily  Primidone 50 mg,  1 po bid Opicapone, 50 mg at bedtime    PREVIOUS MEDICATIONS:  amantadine (no help for tremor so d/c); carbidopa/levodopa 25/100; requip xl; primidone; entacapone - given but she didn't take it; opicapone; carbidopa/levodopa 50/200 (she took 3 dosages and d/c - no help for stutter steps at bed)   ALLERGIES:   Allergies  Allergen Reactions   Amoxicillin-Pot Clavulanate Other (See Comments)   Latex Other (See Comments)   Penicillins Hives    Did it involve swelling of the face/tongue/throat, SOB, or low BP? No Did it involve sudden or severe rash/hives, skin peeling, or any reaction on the inside of your mouth or nose? Yes Did you need to seek medical attention at a hospital or doctor's office? Yes When did  it last happen?      5 years If all above answers are "NO", may proceed with cephalosporin use. Tolerated ancef 03/16/20    CURRENT MEDICATIONS:  Outpatient Encounter Medications as of 10/22/2023  Medication Sig   atorvastatin (LIPITOR) 20 MG tablet Take 1 tablet (20 mg total) by mouth daily.   carbidopa-levodopa (SINEMET IR) 25-100 MG tablet TAKE 1.5 TABLETS BY MOUTH AT 7AM; 1 TABLET AT 11AM; 1.5 TABLET AT 3PM; AND 1 TABLET AT 7 PM.   glucosamine-chondroitin 500-400 MG tablet Take 1 tablet by mouth 3 (three) times daily.   Mag Oxide-Vit D3-Turmeric [phone] MG-UNIT-MG TABS Take by mouth.   primidone (MYSOLINE) 50 MG tablet TAKE 1 TABLET(50 MG) BY MOUTH TWICE DAILY   [DISCONTINUED] Opicapone (ONGENTYS) 50 MG CAPS  Take 1 capsule by mouth daily.   [DISCONTINUED] rOPINIRole (REQUIP XL) 8 MG 24 hr tablet TAKE 1 TABLET(8 MG) BY MOUTH DAILY   Opicapone (ONGENTYS) 50 MG CAPS Take 1 capsule by mouth daily.   rOPINIRole (REQUIP XL) 8 MG 24 hr tablet TAKE 1 TABLET(8 MG) BY MOUTH DAILY   [DISCONTINUED] Acetaminophen 500 MG capsule Take by mouth.   No facility-administered encounter medications on file as of 10/22/2023.    Objective:   PHYSICAL EXAMINATION:    VITALS:   Vitals:   10/22/23 0836  BP: 120/72  Pulse: 74  SpO2: 96%  Weight: 151 lb 3.2 oz (68.6 kg)  Height: 5\' 6"  (1.676 m)     GEN:  The patient appears stated age and is in NAD. HEENT:  Normocephalic, atraumatic.  The mucous membranes are moist.  Neurological examination: CV:  RRR Lungs:  CTAB   Orientation: The patient is alert and oriented x3. Cranial nerves: There is good facial symmetry with mild facial hypomimia. The speech is fluent and clear. Soft palate rises symmetrically and there is no tongue deviation. Hearing is intact to conversational tone. Sensation: Sensation is intact to light touch throughout Motor: Strength is at least antigravity x4.  Movement examination: Tone: There is normal tone in the upper and lower extremities today.  She  Abnormal movements: no rest tremor.  Min postural tremor.   Coordination:  There is no decremation, with any form of RAMS, including alternating supination and pronation of the forearm, hand opening and closing, finger taps, heel taps and toe taps. Gait and Station: The patient has no difficulty arising out of a deep-seated chair without the use of the hands. 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  Gait is good today with good stride length.   No shuffling  I have reviewed and interpreted the following labs independently    Chemistry      Component Value Date/Time   NA 143 09/20/2023 1240   K 4.9 09/20/2023 1240   CL 103 09/20/2023 1240    CO2 24 09/20/2023 1240   BUN 17 09/20/2023 1240   CREATININE 0.79 09/20/2023 1240      Component Value Date/Time   CALCIUM 9.4 09/20/2023 1240   ALKPHOS 102 09/20/2023 1240   AST 19 09/20/2023 1240   ALT 8 09/20/2023 1240   BILITOT 0.3 09/20/2023 1240       Lab Results  Component Value Date   WBC 7.8 09/20/2023   HGB 13.6 09/20/2023   HCT 42.2 09/20/2023   MCV 92 09/20/2023   PLT 250 09/20/2023    Lab Results  Component Value Date   TSH 1.270 09/20/2023   Total time  spent on today's visit was 30 minutes, including both face-to-face time and nonface-to-face time.  Time included that spent on review of records (prior notes available to me/labs/imaging if pertinent), discussing treatment and goals, answering patient's questions and coordinating care.    Cc:  Laurann Montana, MD

## 2023-10-22 ENCOUNTER — Encounter: Payer: Self-pay | Admitting: Neurology

## 2023-10-22 ENCOUNTER — Ambulatory Visit (HOSPITAL_COMMUNITY): Payer: Medicare PPO | Attending: Cardiovascular Disease

## 2023-10-22 ENCOUNTER — Ambulatory Visit: Payer: Medicare PPO | Admitting: Neurology

## 2023-10-22 VITALS — BP 120/72 | HR 74 | Ht 66.0 in | Wt 151.2 lb

## 2023-10-22 DIAGNOSIS — I959 Hypotension, unspecified: Secondary | ICD-10-CM

## 2023-10-22 DIAGNOSIS — I491 Atrial premature depolarization: Secondary | ICD-10-CM

## 2023-10-22 DIAGNOSIS — R351 Nocturia: Secondary | ICD-10-CM

## 2023-10-22 DIAGNOSIS — I471 Supraventricular tachycardia, unspecified: Secondary | ICD-10-CM

## 2023-10-22 DIAGNOSIS — Z87898 Personal history of other specified conditions: Secondary | ICD-10-CM

## 2023-10-22 DIAGNOSIS — G20A1 Parkinson's disease without dyskinesia, without mention of fluctuations: Secondary | ICD-10-CM | POA: Diagnosis not present

## 2023-10-22 DIAGNOSIS — G250 Essential tremor: Secondary | ICD-10-CM | POA: Diagnosis not present

## 2023-10-22 DIAGNOSIS — R5383 Other fatigue: Secondary | ICD-10-CM

## 2023-10-22 DIAGNOSIS — I493 Ventricular premature depolarization: Secondary | ICD-10-CM | POA: Diagnosis not present

## 2023-10-22 DIAGNOSIS — I2510 Atherosclerotic heart disease of native coronary artery without angina pectoris: Secondary | ICD-10-CM | POA: Diagnosis not present

## 2023-10-22 DIAGNOSIS — I700 Atherosclerosis of aorta: Secondary | ICD-10-CM | POA: Diagnosis not present

## 2023-10-22 DIAGNOSIS — G4730 Sleep apnea, unspecified: Secondary | ICD-10-CM

## 2023-10-22 LAB — ECHOCARDIOGRAM COMPLETE
Area-P 1/2: 2.63 cm2
Height: 66 in
S' Lateral: 2.2 cm
Weight: 2419.2 [oz_av]

## 2023-10-22 MED ORDER — ROPINIROLE HCL ER 8 MG PO TB24
ORAL_TABLET | ORAL | 1 refills | Status: DC
Start: 2023-10-22 — End: 2024-07-30

## 2023-10-22 MED ORDER — ONGENTYS 50 MG PO CAPS
1.0000 | ORAL_CAPSULE | Freq: Every day | ORAL | 1 refills | Status: DC
Start: 2023-10-22 — End: 2024-06-03

## 2023-10-25 ENCOUNTER — Telehealth: Payer: Self-pay | Admitting: Pharmacy Technician

## 2023-10-25 ENCOUNTER — Other Ambulatory Visit (HOSPITAL_COMMUNITY): Payer: Self-pay

## 2023-10-25 NOTE — Telephone Encounter (Signed)
Pharmacy Patient Advocate Encounter   Received notification from Physician's Office that prior authorization for ONGENTYS 50MG  is required/requested.   Insurance verification completed.   The patient is insured through [employer] .   Per test claim: APPROVED from 10.1.24 to 12.31.25. Unable to obtain price due to refill too soon rejection, last fill date 11.19.24 next available fill date2.2.25

## 2023-10-30 NOTE — Telephone Encounter (Signed)
Call placed to pt to follow up on the Itamar sleep study.  Per pt, she hasn't done it yet, but will have it done by the weekend.  Pt was thankful for the reminder call.

## 2023-11-11 ENCOUNTER — Encounter (INDEPENDENT_AMBULATORY_CARE_PROVIDER_SITE_OTHER): Payer: Medicare PPO | Admitting: Cardiology

## 2023-11-11 DIAGNOSIS — G4733 Obstructive sleep apnea (adult) (pediatric): Secondary | ICD-10-CM | POA: Diagnosis not present

## 2023-11-14 DIAGNOSIS — Z789 Other specified health status: Secondary | ICD-10-CM | POA: Diagnosis not present

## 2023-11-14 DIAGNOSIS — L538 Other specified erythematous conditions: Secondary | ICD-10-CM | POA: Diagnosis not present

## 2023-11-14 DIAGNOSIS — L820 Inflamed seborrheic keratosis: Secondary | ICD-10-CM | POA: Diagnosis not present

## 2023-11-14 DIAGNOSIS — D2262 Melanocytic nevi of left upper limb, including shoulder: Secondary | ICD-10-CM | POA: Diagnosis not present

## 2023-11-14 DIAGNOSIS — R208 Other disturbances of skin sensation: Secondary | ICD-10-CM | POA: Diagnosis not present

## 2023-11-14 DIAGNOSIS — D485 Neoplasm of uncertain behavior of skin: Secondary | ICD-10-CM | POA: Diagnosis not present

## 2023-11-14 DIAGNOSIS — L821 Other seborrheic keratosis: Secondary | ICD-10-CM | POA: Diagnosis not present

## 2023-11-14 DIAGNOSIS — D225 Melanocytic nevi of trunk: Secondary | ICD-10-CM | POA: Diagnosis not present

## 2023-11-14 DIAGNOSIS — D2271 Melanocytic nevi of right lower limb, including hip: Secondary | ICD-10-CM | POA: Diagnosis not present

## 2023-11-16 DIAGNOSIS — R238 Other skin changes: Secondary | ICD-10-CM

## 2023-11-16 DIAGNOSIS — M7989 Other specified soft tissue disorders: Secondary | ICD-10-CM

## 2023-11-23 NOTE — Telephone Encounter (Signed)
Contacted the pt regarding Katie Daniel.  Per pt, she completed it 3 weeks ago, and it stated "Successful" on the device.  I will fwd to [place] and [place] to see if someone has results.

## 2023-11-27 ENCOUNTER — Ambulatory Visit: Payer: Medicare PPO | Attending: Physician Assistant

## 2023-11-27 DIAGNOSIS — G4730 Sleep apnea, unspecified: Secondary | ICD-10-CM

## 2023-11-27 NOTE — Procedures (Signed)
   SLEEP STUDY REPORT Patient Information Study Date: 11/11/2023 Patient Name: Katie Daniel Patient ID: [id number] Birth Date: [birth year] Age: 77 Gender: Female BMI: 23.7 (W=148 lb, H=5' 6'') Stopbang: 4 Referring Physician: Ronie Spies, PA  TEST DESCRIPTION: Home sleep apnea testing was completed using the WatchPat, a Type 1 device, utilizing  peripheral arterial tonometry (PAT), chest movement, actigraphy, pulse oximetry, pulse rate, body position and snore.  AHI was calculated with apnea and hypopnea using valid sleep time as the denominator. RDI includes apneas,  hypopneas, and RERAs. The data acquired and the scoring of sleep and all associated events were performed in  accordance with the recommended standards and specifications as outlined in the AASM Manual for the Scoring of  Sleep and Associated Events 2.2.0 (2015).   FINDINGS:   1. Mild Obstructive Sleep Apnea with AHI 9.8/hr.   2. No Central Sleep Apnea with pAHIc 0.5/hr.   3. Oxygen desaturations as low as 88%.   4. Moderate to severe snoring was present. O2 sats were < 88% for 0 min.   5. Total sleep time was 7 hrs and 38 min.   6. 35.5% of total sleep time was spent in REM sleep.   7. Shortened sleep onset latency at 6 min.   8. Normal REM sleep onset latency at 95 min.   9. Total awakenings were 7.  10. Arrhythmia detection: None  DIAGNOSIS: Mild Obstructive Sleep Apnea (G47.33)  RECOMMENDATIONS: 1. Clinical correlation of these findings is necessary. The decision to treat obstructive sleep apnea (OSA) is usually  based on the presence of apnea symptoms or the presence of associated medical conditions such as Hypertension,  Congestive Heart Failure, Atrial Fibrillation or Obesity. The most common symptoms of OSA are snoring, gasping for  breath while sleeping, daytime sleepiness and fatigue.   2. Initiating apnea therapy is recommended given the presence of symptoms and/or associated conditions.  Recommend  proceeding with one of the following:   a. Auto-CPAP therapy with a pressure range of 5-20cm H2O.   b. An oral appliance (OA) that can be obtained from certain dentists with expertise in sleep medicine. These are  primarily of use in non-obese patients with mild and moderate disease.   c. An ENT consultation which may be useful to look for specific causes of obstruction and possible treatment  options.   d. If patient is intolerant to PAP therapy, consider referral to ENT for evaluation for hypoglossal nerve stimulator.   3. Close follow-up is necessary to ensure success with CPAP or oral appliance therapy for maximum benefit .  4. A follow-up oximetry study on CPAP is recommended to assess the adequacy of therapy and determine the need  for supplemental oxygen or the potential need for Bi-level therapy. An arterial blood gas to determine the adequacy of  baseline ventilation and oxygenation should also be considered.  5. Healthy sleep recommendations include: adequate nightly sleep (normal 7-9 hrs/night), avoidance of caffeine after  noon and alcohol near bedtime, and maintaining a sleep environment that is cool, dark and quiet.  6. Weight loss for overweight patients is recommended. Even modest amounts of weight loss can significantly  improve the severity of sleep apnea.  7. Snoring recommendations include: weight loss where appropriate, side sleeping, and avoidance of alcohol before  bed.  8. Operation of motor vehicle should be avoided when sleepy.  Signature: Armanda Magic, MD; [facility]; Diplomat, American Board of Sleep  Medicine Electronically Signed: 11/27/2023 5:37:52 PM

## 2023-11-29 ENCOUNTER — Encounter (HOSPITAL_COMMUNITY): Payer: Self-pay

## 2023-11-29 ENCOUNTER — Telehealth: Payer: Self-pay | Admitting: *Deleted

## 2023-11-29 ENCOUNTER — Ambulatory Visit (HOSPITAL_BASED_OUTPATIENT_CLINIC_OR_DEPARTMENT_OTHER)
Admission: RE | Admit: 2023-11-29 | Discharge: 2023-11-29 | Disposition: A | Discharge status: Home / Self Care | Payer: Medicare PPO | Source: Ambulatory Visit | Attending: Surgery | Admitting: Surgery

## 2023-11-29 ENCOUNTER — Other Ambulatory Visit (HOSPITAL_COMMUNITY): Payer: Self-pay

## 2023-11-29 ENCOUNTER — Ambulatory Visit (HOSPITAL_COMMUNITY)
Admission: RE | Admit: 2023-11-29 | Discharge: 2023-11-29 | Disposition: A | Discharge status: Home / Self Care | Payer: Medicare PPO | Source: Ambulatory Visit | Attending: Cardiology | Admitting: Cardiology

## 2023-11-29 VITALS — BP 149/77 | HR 68 | Wt 151.5 lb

## 2023-11-29 DIAGNOSIS — M7989 Other specified soft tissue disorders: Secondary | ICD-10-CM | POA: Diagnosis not present

## 2023-11-29 DIAGNOSIS — I8002 Phlebitis and thrombophlebitis of superficial vessels of left lower extremity: Secondary | ICD-10-CM | POA: Diagnosis not present

## 2023-11-29 DIAGNOSIS — Z7901 Long term (current) use of anticoagulants: Secondary | ICD-10-CM | POA: Insufficient documentation

## 2023-11-29 DIAGNOSIS — R238 Other skin changes: Secondary | ICD-10-CM

## 2023-11-29 MED ORDER — WARFARIN SODIUM 5 MG PO TABS
5.0000 mg | ORAL_TABLET | Freq: Every day | ORAL | 0 refills | Status: DC
  Filled 2023-11-29: qty 30, 30d supply, fill #0

## 2023-11-29 MED ORDER — ENOXAPARIN SODIUM 100 MG/ML IJ SOSY
100.0000 mg | PREFILLED_SYRINGE | INTRAMUSCULAR | 0 refills | Status: DC
  Filled 2023-11-29: qty 10, 10d supply, fill #0

## 2023-11-29 NOTE — Telephone Encounter (Signed)
Spoke to Katie Daniel regarding her symptoms.  She is c/o Swollen, Pink, warm, tender knot in her left inner thigh.  She is worried it may be a blood clot.  Spoke to DOD (Dr. Jens Som) who advised we order US/Dopplers to r/o DVT.  Katie Daniel scheduled at [facility] location, for today 11/29/2023 at 12pm.  Katie Daniel was notified about this.  She verbalized understanding.  Lives in [place], but will be able to drive to [place].  Katie Daniel asked about results, she understands that the doctor has to review and give the results to the RN/LPN/CMA, who will then relay them back to Katie Daniel.

## 2023-11-29 NOTE — Patient Instructions (Addendum)
-  Start warfarin 5 mg daily. Start enoxaparin (Lovenox) 1 syringe (100 mg) once daily.  -It is important to take your medication around the same time every day.  -Avoid NSAIDs like ibuprofen (Advil, Motrin) and naproxen (Aleve) as well as aspirin doses over 100 mg daily. -Tylenol (acetaminophen) is the preferred over the counter pain medication to lower the risk of bleeding. -Be sure to alert all of your health care providers that you are taking an anticoagulant prior to starting a new medication or having a procedure. -Monitor for signs and symptoms of bleeding (abnormal bruising, prolonged bleeding, nose bleeds, bleeding from gums, discolored urine, black tarry stools). If you have fallen and hit your head OR if your bleeding is severe or not stopping, seek emergency care.  -Go to the emergency room if emergent signs and symptoms of new clot occur (new or worse swelling and pain in an arm or leg, shortness of breath, chest pain, fast or irregular heartbeats, lightheadedness, dizziness, fainting, coughing up blood) or if you experience a significant color change (pale or blue) in the extremity that has the DVT.   Your next visit is on Tuesday, December 31st at 10am.  [facility] [address] [place], [place], [place] [address] Enter the hospital through Entrance C off [facility] and pull up to the Heart [facility] Vascular Center entrance to the free valet parking.  Check in for your appointment at the [facility] Vascular Center.   If you have any questions or need to reschedule an appointment, please call [phone] [facility].  If you are having an emergency, call 911 or present to the nearest emergency room.   What is a DVT?  -Deep vein thrombosis (DVT) is a condition in which a blood clot forms in a vein of the deep venous system which can occur in the lower leg, thigh, pelvis, arm, or neck. This condition is serious and can be life-threatening if the clot travels to the arteries  of the lungs and causing a blockage (pulmonary embolism, PE). A DVT can also damage veins in the leg, which can lead to long-term venous disease, leg pain, swelling, discoloration, and ulcers or sores (post-thrombotic syndrome).  -Treatment may include taking an anticoagulant medication to prevent more clots from forming and the current clot from growing, wearing compression stockings, and/or surgical procedures to remove or dissolve the clot.

## 2023-11-29 NOTE — Addendum Note (Signed)
Addended by: Morrell Riddle on: 11/29/2023 09:23 AM   Modules accepted: Orders

## 2023-11-29 NOTE — Progress Notes (Addendum)
DVT Clinic Note  Name: Katie Daniel     MRN: [id number]     DOB: 1946-08-07     Sex: female  PCP: Laurann Montana, MD  Today's Visit: Visit Information: Initial Visit  Referred to DVT Clinic by:  Cardiology [facility])  - Dr. Jens Som Referred to CPP by: Dr. Myra Gianotti Reason for referral:  Chief Complaint  Patient presents with   Superficial vein thrombosis   HISTORY OF PRESENT ILLNESS: Katie Daniel is a 77 y.o. female with PMH CAD, aortic atherosclerosis, Parkinson's disease, who presents after diagnosis of DVT for medication management. Patient has a remote questionable history of DVT. In 07/2014 she was found to have an acute DVT involving the left calf veins and started on Xarelto. She was seen by a vein specialist shortly after that and MRI showed no DVT so she was taken off Xarelto. She also has a history of acute superficial vein thrombosis in the left mid calf 09/2020 which was treated with only symptom management. She developed left medial thigh redness and tenderness and can feel a lump in her medial thigh. Doppler today showed acute superficial vein thrombosis in the left great saphenous vein from the mid calf up to the saphenofemoral junction, and Dr. Jens Som referred the patient to the DVT Clinic. Due to its proximity to the [facility], the tech spoke with Dr. Karin Lieu at [facility] who advised starting anticoagulation for this. Patient denies any recent injuries, illness, travel, or immobility. She has a history of varicose veins. She admits to being more dehydrated lately.   Positive Thrombotic Risk Factors: Older Age Bleeding Risk Factors: Age >65 years  Negative Thrombotic Risk Factors: Previous VTE, Recent surgery (within 3 months), Recent trauma (within 3 months), Recent admission to hospital with acute illness (within 3 months), Paralysis, paresis, or recent plaster cast immobilization of lower extremity, Central venous catheterization, Bed rest >72 hours within 3 months, Sedentary  journey lasting >8 hours within 4 weeks, Pregnancy, Within 6 weeks postpartum, Recent cesarean section (within 3 months), Estrogen therapy, Testosterone therapy, Erythropoiesis-stimulating agent, Recent COVID diagnosis (within 3 months), Active cancer, Non-malignant, chronic inflammatory condition, Known thrombophilic condition, Smoking, Obesity  Rx Insurance Coverage: Medicare Rx Affordability: Enoxaparin (generic) is $10 for a 10 day supply Preferred Pharmacy: Filled enoxaparin and warfarin at [facility] today during her visit.   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    34.4 on CT 04/2021   Aortic atherosclerosis (HCC)    noted on chest CT 04/2021   Arthritis    DVT (deep venous thrombosis) (HCC)    Essential tremor    Fam hx-ischem heart disease    Fatigue    Hematoma    OF LEFT CALF   Left arm pain    Osteoarthritis    Osteopenia    Parkinson disease (HCC)    Squamous cell skin cancer    Scalp and side of nose   Vitamin D deficiency     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   Procedure: TOTAL KNEE ARTHROPLASTY;  Surgeon: Durene Romans, MD;  Location: [facility];  Service: Orthopedics;  Laterality: Right;  70 mins    Social History   Socioeconomic History   Marital status: Widowed    Spouse name: Not on  file   Number of children: 2   Years of education: 2   Highest education level: Not on file  Occupational History   Occupation: retired  Tobacco Use   Smoking status: Never   Smokeless tobacco: Never  Vaping Use   Vaping status: Never Used  Substance and Sexual Activity   Alcohol use: Yes    Comment: glass of wine occ - 1 time per week   Drug use: Never   Sexual activity: Not on file  Other Topics Concern   Not on file  Social History Narrative    Drinks caffeine   Right handed   One story home   Social Drivers of Health   Financial Resource Strain: Not on file  Food Insecurity: Not on file  Transportation Needs: Not on file  Physical Activity: Not on file  Stress: Not on file  Social Connections: Not on file  Intimate Partner Violence: Not on file    Family History  Problem Relation Age of Onset   Heart attack Mother        Died of MI at 62   Stroke Father    Heart attack Father        MI in his 42's   Breast cancer Child    CAD Paternal Uncle        MI in 38's    Allergies as of 11/29/2023 - Review Complete 11/29/2023  Allergen Reaction Noted   Amoxicillin-pot clavulanate Other (See Comments) 01/25/2021   Latex Other (See Comments) 01/25/2021   Penicillins Hives 03/02/2020    Current Outpatient Medications on File Prior to Encounter  Medication Sig Dispense Refill   atorvastatin (LIPITOR) 20 MG tablet Take 1 tablet (20 mg total) by mouth daily. 90 tablet 3   carbidopa-levodopa (SINEMET IR) 25-100 MG tablet TAKE 1.5 TABLETS BY MOUTH AT 7AM; 1 TABLET AT 11AM; 1.5 TABLET AT 3PM; AND 1 TABLET AT 7 PM. 450 tablet 0   cholecalciferol (VITAMIN D3) 25 MCG (1000 UNIT) tablet Take 1,000 Units by mouth daily.     glucosamine-chondroitin 500-400 MG tablet Take 1 tablet by mouth 3 (three) times daily.     Opicapone (ONGENTYS) 50 MG CAPS Take 1 capsule by mouth daily. 90 capsule 1   primidone (MYSOLINE) 50 MG tablet TAKE 1 TABLET(50 MG) BY MOUTH TWICE DAILY 180 tablet 0   rOPINIRole (REQUIP XL) 8 MG 24 hr tablet TAKE 1 TABLET(8 MG) BY MOUTH DAILY 90 tablet 1   No current facility-administered medications on file prior to encounter.   REVIEW OF SYSTEMS:  Review of Systems  Respiratory:  Negative for shortness of breath.   Cardiovascular:  Negative for chest pain, palpitations and leg swelling.  Musculoskeletal:  Positive for myalgias.  Neurological:  Positive for tingling. Negative for dizziness.   PHYSICAL EXAMINATION:   Vitals:   11/29/23 1343  BP: (!) 149/77  Pulse: 68  SpO2: 100%  Weight: 151 lb 8 oz (68.7 kg)    Body mass index is 24.45 kg/m.  Physical Exam Vitals reviewed.  Cardiovascular:     Rate and Rhythm: Normal rate.  Pulmonary:     Effort: Pulmonary effort is normal.  Musculoskeletal:        General: Swelling (bilateral trace edema) and tenderness present.  Skin:    Findings: Erythema present. No bruising.  Psychiatric:        Mood and Affect: Mood normal.        Behavior: Behavior normal.  Thought Content: Thought content normal.   Villalta Score for Post-Thrombotic Syndrome: Pain: Moderate Cramps: Moderate Heaviness: Moderate Paresthesia: Moderate Pruritus: Moderate Pretibial Edema: Mild Skin Induration: Absent Hyperpigmentation: Absent Redness: Mild Venous Ectasia: Mild Pain on calf compression: Mild Villalta Preliminary Score: 14 Is venous ulcer present?: No If venous ulcer is present and score is <15, then 15 points total are assigned: Absent Villalta Total Score: 14  LABS:  CBC     Component Value Date/Time   WBC 7.8 09/20/2023 1240   WBC 13.2 (H) 03/17/2020 0341   RBC 4.59 09/20/2023 1240   RBC 3.91 03/17/2020 0341   HGB 13.6 09/20/2023 1240   HCT 42.2 09/20/2023 1240   PLT 250 09/20/2023 1240   MCV 92 09/20/2023 1240   MCH 29.6 09/20/2023 1240   MCH 29.7 03/17/2020 0341   MCHC 32.2 09/20/2023 1240   MCHC 31.2 03/17/2020 0341   RDW 13.5 09/20/2023 1240    Hepatic Function      Component Value Date/Time   PROT 6.7 09/20/2023 1240   ALBUMIN 3.9 09/20/2023 1240   AST 19 09/20/2023 1240   ALT 8 09/20/2023 1240   ALKPHOS 102 09/20/2023 1240   BILITOT 0.3 09/20/2023 1240    Renal Function   Lab Results  Component Value Date   CREATININE 0.79 09/20/2023   CREATININE 0.62 03/17/2020   CREATININE 0.69 03/09/2020    CrCl cannot be calculated (Patient's most recent lab result is older than the maximum 21 days allowed.).   [facility]  Studies:  11/29/23 VAS [place] LOWER EXTREMITY VENOUS (DVT) Summary:  LEFT:  - Findings consistent with acute superficial vein thrombosis involving the  left great saphenous vein from the mid calf up to the saphenofemoral  junction. Thrombus appears mildly mobile at the [facility].  - There is no evidence of deep vein thrombosis in the lower extremity.   ASSESSMENT: Location of DVT: Left superficial vein  Since the thrombus was within 3 cm of the saphenofemoral junction, the sonographer at [facility] consulted with Dr. Karin Lieu who advised starting anticoagulation for this, so the patient was scheduled in DVT Clinic. I spoke with Dr. Karin Lieu as well and he recommended anticoagulating for a total of 3 months for superficial thrombophlebitis. The patient is taking primidone, which is a strong CYP3A4 inducer. Unable to use Xarelto or Eliquis due to primidone significantly decreasing their concentrations. Will initiate warfarin with a Lovenox bridge. CrCl is 54.66 mL/min (using Scr 0.79 and ideal body weight), so we can use enoxaparin 1.5 mg/kg/day and limit injections to once daily. Will check her INR at least 5 days after starting warfarin. Primidone also decreases concentrations of warfarin but we can adjust the dose to ensure she maintains a therapeutic INR with goal of 2-3. Lovenox and warfarin were filled at our pharmacy during the visit, and I counseled the patient extensively on these medications. All of her questions have been answered at this time. She successfully self-administered her first dose during the visit into the right abdomen.   PLAN: -Start warfarin (Coumadin) 5 mg daily and enoxaparin (Lovenox) 1.5 mg/kg (100 mg) every 24 hours. -Expected duration of therapy: 3 months. Therapy started on 11/29/23. -Patient educated on purpose, proper use and potential adverse effects of warfarin (Coumadin) enoxaparin (Lovenox). -Discussed importance of taking medication around the same time every day. -Advised  patient of medications to avoid (NSAIDs, aspirin doses >100 mg daily). -Educated that Tylenol (acetaminophen) is the preferred analgesic to lower the risk of bleeding. -Advised patient  to alert all providers of anticoagulation therapy prior to starting a new medication or having a procedure. -Emphasized importance of monitoring for signs and symptoms of bleeding (abnormal bruising, prolonged bleeding, nose bleeds, bleeding from gums, discolored urine, black tarry stools). -Educated patient to present to the ED if emergent signs and symptoms of new thrombosis occur. -Counseled patient to wear compression stockings daily, removing at night. Elevate legs to help with swelling.   Follow up: 5 days for INR check  Pervis Hocking, PharmD, BCACP, CPP Deep Vein Thrombosis Clinic Clinical Pharmacist Practitioner Office: [phone]  I have evaluated the patient's chart/imaging and refer this patient to the Clinical Pharmacist Practitioner for medication management. I have reviewed the CPP's documentation and agree with her assessment and plan. I was immediately available during the visit for questions and collaboration.   Durene Cal, MD

## 2023-12-03 NOTE — Progress Notes (Signed)
 DVT Clinic Note  Name: Katie Daniel     MRN: [id number]     DOB: 12/21/1945     Sex: female  PCP: Teresa Channel, MD  Today's Visit: Visit Information: Follow Up Visit  Referred to DVT Clinic by:  Cardiology [facility])  - Dr. Pietro Referred to CPP by: Dr. Sheree Reason for referral:  Chief Complaint  Patient presents with   Warfarin management   HISTORY OF PRESENT ILLNESS: Katie Daniel is a 77 y.o. female with PMH CAD, aortic atherosclerosis, Parkinson's disease, who presents after diagnosis of DVT for medication management. Patient has a remote questionable history of DVT. In 07/2014 she was found to have an acute DVT involving the left calf veins and started on Xarelto. She was seen by a vein specialist shortly after that and MRI showed no DVT so she was taken off Xarelto. She also has a history of acute superficial vein thrombosis in the left mid calf 09/2020 which was treated with symptom management. She recently developed left medial thigh redness and tenderness and could feel a lump in her medial thigh. No recent injuries, illness, travel, or immobility. She has a history of varicose veins. She admits to being more dehydrated lately. Doppler 11/29/23 showed acute superficial vein thrombosis in the left great saphenous vein from the mid calf up to the saphenofemoral junction, and Dr. Pietro referred the patient to the DVT Clinic. Due to its proximity to the [facility], Dr. Lanis recommending treating with anticoagulation for 3 months. Due to DOAC drug interaction with primidone , warfarin was started on 11/29/23 and bridged with Lovenox .   Today, patient reports that her leg is starting to feel better. She has not noticed it feeling tender anymore. Denies abnormal bleeding or bruising. Denies missed doses of warfarin or Lovenox . She has been successfully self-administering the Lovenox  injections.   Positive Thrombotic Risk Factors: Older Age Bleeding Risk Factors: Age >65 years,  Anticoagulant therapy  Negative Thrombotic Risk Factors: Previous VTE, Recent surgery (within 3 months), Recent trauma (within 3 months), Recent admission to hospital with acute illness (within 3 months), Paralysis, paresis, or recent plaster cast immobilization of lower extremity, Central venous catheterization, Bed rest >72 hours within 3 months, Sedentary journey lasting >8 hours within 4 weeks, Pregnancy, Within 6 weeks postpartum, Recent cesarean section (within 3 months), Estrogen therapy, Testosterone therapy, Erythropoiesis-stimulating agent, Recent COVID diagnosis (within 3 months), Active cancer, Non-malignant, chronic inflammatory condition, Known thrombophilic condition, Smoking, Obesity  Rx Insurance Coverage: Medicare Rx Affordability: Enoxaparin  (generic) is $10 for a 10 day supply. Warfarin is $4.  Preferred Pharmacy: Filled enoxaparin  and warfarin at [facility] during her initial visit.   Past Medical History:  Diagnosis Date   Agatston coronary artery calcium  score less than 100    34.4 on CT 04/2021   Aortic atherosclerosis (HCC)    noted on chest CT 04/2021   Arthritis    DVT (deep venous thrombosis) (HCC)    Essential tremor    Fam hx-ischem heart disease    Fatigue    Hematoma    OF LEFT CALF   Left arm pain    Osteoarthritis    Osteopenia    Parkinson disease (HCC)    Squamous cell skin cancer    Scalp and side of nose   Vitamin D  deficiency     Past Surgical History:  Procedure Laterality Date   COLONOSCOPY     KNEE ARTHROSCOPY WITH MENISCAL REPAIR Right  MOUTH SURGERY     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   Procedure: TOTAL KNEE ARTHROPLASTY;  Surgeon: Ernie Cough, MD;  Location: [facility];  Service: Orthopedics;  Laterality: Right;  70 mins    Social History   Socioeconomic History   Marital status: Widowed    Spouse name: Not on file   Number of children: 2   Years of education: 29   Highest education level: Not  on file  Occupational History   Occupation: retired  Tobacco Use   Smoking status: Never   Smokeless tobacco: Never  Vaping Use   Vaping status: Never Used  Substance and Sexual Activity   Alcohol use: Yes    Comment: glass of wine occ - 1 time per week   Drug use: Never   Sexual activity: Not on file  Other Topics Concern   Not on file  Social History Narrative   Drinks caffeine   Right handed   One story home   Social Drivers of Health   Financial Resource Strain: Not on file  Food Insecurity: Not on file  Transportation Needs: Not on file  Physical Activity: Not on file  Stress: Not on file  Social Connections: Not on file  Intimate Partner Violence: Not on file    Family History  Problem Relation Age of Onset   Heart attack Mother        Died of MI at 41   Stroke Father    Heart attack Father        MI in his 43's   Breast cancer Child    CAD Paternal Uncle        MI in 43's    Allergies as of 12/04/2023 - Review Complete 12/04/2023  Allergen Reaction Noted   Amoxicillin-pot clavulanate Other (See Comments) 01/25/2021   Latex Other (See Comments) 01/25/2021   Penicillins Hives 03/02/2020    Current Outpatient Medications on File Prior to Encounter  Medication Sig Dispense Refill   enoxaparin  (LOVENOX ) 100 MG/ML injection Inject 1 mL (100 mg total) into the skin daily. Until INR >2. 10 mL 0   warfarin (COUMADIN ) 5 MG tablet Take 1 tablet (5 mg total) by mouth daily. Or as instructed by DVT Clinic. 30 tablet 0   atorvastatin  (LIPITOR) 20 MG tablet Take 1 tablet (20 mg total) by mouth daily. 90 tablet 3   carbidopa -levodopa  (SINEMET  IR) 25-100 MG tablet TAKE 1.5 TABLETS BY MOUTH AT 7AM; 1 TABLET AT 11AM; 1.5 TABLET AT 3PM; AND 1 TABLET AT 7 PM. 450 tablet 0   cholecalciferol (VITAMIN D3) 25 MCG (1000 UNIT) tablet Take 1,000 Units by mouth daily.     glucosamine-chondroitin 500-400 MG tablet Take 1 tablet by mouth 3 (three) times daily.     Opicapone  (ONGENTYS )  50 MG CAPS Take 1 capsule by mouth daily. 90 capsule 1   primidone  (MYSOLINE ) 50 MG tablet TAKE 1 TABLET(50 MG) BY MOUTH TWICE DAILY 180 tablet 0   rOPINIRole  (REQUIP  XL) 8 MG 24 hr tablet TAKE 1 TABLET(8 MG) BY MOUTH DAILY 90 tablet 1   No current facility-administered medications on file prior to encounter.   REVIEW OF SYSTEMS:  Review of Systems  Respiratory:  Negative for shortness of breath.   Cardiovascular:  Negative for chest pain, palpitations and leg swelling.  Musculoskeletal:  Negative for myalgias.  Neurological:  Negative for dizziness and tingling.   PHYSICAL EXAMINATION:  Physical Exam Vitals reviewed.  Cardiovascular:     Rate and Rhythm:  Normal rate.  Pulmonary:     Effort: Pulmonary effort is normal.  Musculoskeletal:        General: Swelling present. No tenderness.  Skin:    Findings: No bruising or erythema.  Psychiatric:        Mood and Affect: Mood normal.        Behavior: Behavior normal.        Thought Content: Thought content normal.   Villalta Score for Post-Thrombotic Syndrome: Pain: Mild Cramps: Mild Heaviness: Mild Paresthesia: Absent Pruritus: Mild Pretibial Edema: Mild Skin Induration: Absent Hyperpigmentation: Absent Redness: Absent Venous Ectasia: Mild Pain on calf compression: Absent Villalta Preliminary Score: 6 Is venous ulcer present?: No If venous ulcer is present and score is <15, then 15 points total are assigned: Absent Villalta Total Score: 6  LABS:  CBC     Component Value Date/Time   WBC 7.8 09/20/2023 1240   WBC 13.2 (H) 03/17/2020 0341   RBC 4.59 09/20/2023 1240   RBC 3.91 03/17/2020 0341   HGB 13.6 09/20/2023 1240   HCT 42.2 09/20/2023 1240   PLT 250 09/20/2023 1240   MCV 92 09/20/2023 1240   MCH 29.6 09/20/2023 1240   MCH 29.7 03/17/2020 0341   MCHC 32.2 09/20/2023 1240   MCHC 31.2 03/17/2020 0341   RDW 13.5 09/20/2023 1240    Hepatic Function      Component Value Date/Time   PROT 6.7 09/20/2023 1240    ALBUMIN 3.9 09/20/2023 1240   AST 19 09/20/2023 1240   ALT 8 09/20/2023 1240   ALKPHOS 102 09/20/2023 1240   BILITOT 0.3 09/20/2023 1240    Renal Function   Lab Results  Component Value Date   CREATININE 0.79 09/20/2023   CREATININE 0.62 03/17/2020   CREATININE 0.69 03/09/2020    CrCl cannot be calculated (Patient's most recent lab result is older than the maximum 21 days allowed.).   [facility] Vascular Lab Studies:  11/29/23 VAS US  LOWER EXTREMITY VENOUS (DVT) Summary:  LEFT:  - Findings consistent with acute superficial vein thrombosis involving the  left great saphenous vein from the mid calf up to the saphenofemoral  junction. Thrombus appears mildly mobile at the [facility].  - There is no evidence of deep vein thrombosis in the lower extremity.   ASSESSMENT: Location of DVT: Left superficial vein  Since the thrombus was within 3 cm of the saphenofemoral junction, vascular surgeon Dr. Lanis advised starting anticoagulation and treating for a total of 3 months for superficial thrombophlebitis. The patient is taking primidone , which is a strong CYP3A4 inducer. Unable to use Xarelto or Eliquis due to primidone  significantly decreasing their concentrations. We initiated warfarin with a Lovenox  bridge on 12/03/23. CrCl is 54.66 mL/min (using Scr 0.79 and ideal body weight), allowing the use of enoxaparin  1.5 mg/kg/day which limits injections to once daily. Primidone  also decreases concentrations of warfarin but we can adjust the dose to ensure she maintains a therapeutic INR with goal of 2-3.   Today, INR is subtherapeutic at 1.1 after 5 days of warfarin 5 mg daily. Will boost warfarin and continue Lovenox  until INR >2. Will recheck Friday after 3 days of boosting to determine how much that has impacted her INR. She has already experienced significant symptom improvement since last week. No issues with Lovenox  or warfarin.   PLAN: -Increase warfarin to 10 mg daily for the next three days  (until next INR check). Continue Lovenox  100 mg daily (1.5 mg/kg/day). -Expected duration of therapy: 3 months. Therapy started  on 11/29/23. -Patient educated on purpose, proper use and potential adverse effects of warfarin (Coumadin ) enoxaparin  (Lovenox ). -Discussed importance of taking medication around the same time every day. -Advised patient of medications to avoid (NSAIDs, aspirin  doses >100 mg daily). -Educated that Tylenol  (acetaminophen ) is the preferred analgesic to lower the risk of bleeding. -Advised patient to alert all providers of anticoagulation therapy prior to starting a new medication or having a procedure. -Emphasized importance of monitoring for signs and symptoms of bleeding (abnormal bruising, prolonged bleeding, nose bleeds, bleeding from gums, discolored urine, black tarry stools). -Educated patient to present to the ED if emergent signs and symptoms of new thrombosis occur. -Counseled patient to wear compression stockings daily, removing at night. Elevate legs to help with swelling.   Follow up: Friday for next INR check  Lum Herald, PharmD, BCACP, CPP [facility]rombosis [facility] Clinical Pharmacist Practitioner Office: [phone]

## 2023-12-04 ENCOUNTER — Ambulatory Visit (HOSPITAL_COMMUNITY)
Admission: RE | Admit: 2023-12-04 | Discharge: 2023-12-04 | Disposition: A | Discharge status: Home / Self Care | Payer: Medicare PPO | Source: Ambulatory Visit | Attending: Vascular Surgery | Admitting: Vascular Surgery

## 2023-12-04 DIAGNOSIS — I8002 Phlebitis and thrombophlebitis of superficial vessels of left lower extremity: Secondary | ICD-10-CM | POA: Diagnosis not present

## 2023-12-04 DIAGNOSIS — Z7901 Long term (current) use of anticoagulants: Secondary | ICD-10-CM | POA: Insufficient documentation

## 2023-12-04 LAB — POCT INR: INR: 1.1 — AB (ref 2.0–3.0)

## 2023-12-04 NOTE — Patient Instructions (Addendum)
-  Continue Lovenox  1 syringe (100 mg) daily. Increase warfarin to 10 mg (2 tablets) daily for the next 3 days. Recheck INR on Friday.  -It is important to take your medication around the same time every day.  -Avoid NSAIDs like ibuprofen (Advil, Motrin) and naproxen (Aleve) as well as aspirin  doses over 100 mg daily. -Tylenol  (acetaminophen ) is the preferred over the counter pain medication to lower the risk of bleeding. -Be sure to alert all of your health care providers that you are taking an anticoagulant prior to starting a new medication or having a procedure. -Monitor for signs and symptoms of bleeding (abnormal bruising, prolonged bleeding, nose bleeds, bleeding from gums, discolored urine, black tarry stools). If you have fallen and hit your head OR if your bleeding is severe or not stopping, seek emergency care.  -Go to the emergency room if emergent signs and symptoms of new clot occur (new or worse swelling and pain in an arm or leg, shortness of breath, chest pain, fast or irregular heartbeats, lightheadedness, dizziness, fainting, coughing up blood) or if you experience a significant color change (pale or blue) in the extremity that has the DVT.   Your next visit is on January 3rd at 1:30pm.  [facility] [address] [place], [place], [place] [address] Enter the hospital through Entrance C off [facility] and pull up to the [facility] entrance to the free valet parking.  Check in for your appointment at the [facility].   If you have any questions or need to reschedule an appointment, please call [phone] [facility].  If you are having an emergency, call 911 or present to the nearest emergency room.

## 2023-12-06 ENCOUNTER — Other Ambulatory Visit: Payer: Self-pay | Admitting: Neurology

## 2023-12-06 DIAGNOSIS — G20A1 Parkinson's disease without dyskinesia, without mention of fluctuations: Secondary | ICD-10-CM

## 2023-12-06 DIAGNOSIS — G250 Essential tremor: Secondary | ICD-10-CM

## 2023-12-07 ENCOUNTER — Encounter (HOSPITAL_COMMUNITY): Payer: Self-pay | Admitting: Student-PharmD

## 2023-12-07 ENCOUNTER — Telehealth: Payer: Self-pay

## 2023-12-07 ENCOUNTER — Ambulatory Visit (HOSPITAL_COMMUNITY)
Admission: RE | Admit: 2023-12-07 | Discharge: 2023-12-07 | Disposition: A | Discharge status: Home / Self Care | Payer: Medicare PPO | Source: Ambulatory Visit | Attending: Vascular Surgery | Admitting: Vascular Surgery

## 2023-12-07 ENCOUNTER — Other Ambulatory Visit (HOSPITAL_COMMUNITY): Payer: Self-pay

## 2023-12-07 DIAGNOSIS — Z7901 Long term (current) use of anticoagulants: Secondary | ICD-10-CM | POA: Insufficient documentation

## 2023-12-07 DIAGNOSIS — I8002 Phlebitis and thrombophlebitis of superficial vessels of left lower extremity: Secondary | ICD-10-CM

## 2023-12-07 LAB — POCT INR: INR: 1.6 — AB (ref 2.0–3.0)

## 2023-12-07 MED ORDER — ENOXAPARIN SODIUM 100 MG/ML IJ SOSY
100.0000 mg | PREFILLED_SYRINGE | INTRAMUSCULAR | 0 refills | Status: DC
  Filled 2023-12-07: qty 10, 10d supply, fill #0

## 2023-12-07 NOTE — Telephone Encounter (Signed)
-----   Message from Wilbert Bihari sent at 11/27/2023  5:40 PM EST ----- Please let patient know that they have sleep apnea and recommend treating with CPAP.  Please order an auto CPAP from 4-15cm H2O with heated humidity and mask of choice.  Order overnight pulse ox on CPAP.  Followup with me in 6 weeks.

## 2023-12-07 NOTE — Patient Instructions (Addendum)
 Continue Lovenox  1 syringe (100 mg) daily. Increase warfarin to 12.5 mg (2.5 tablets) today and tomorrow then continue 10 mg (2 tablets) daily. Recheck INR on Tuesday.   -It is important to take your medication around the same time every day.  -Avoid NSAIDs like ibuprofen (Advil, Motrin) and naproxen (Aleve) as well as aspirin  doses over 100 mg daily. -Tylenol  (acetaminophen ) is the preferred over the counter pain medication to lower the risk of bleeding. -Be sure to alert all of your health care providers that you are taking an anticoagulant prior to starting a new medication or having a procedure. -Monitor for signs and symptoms of bleeding (abnormal bruising, prolonged bleeding, nose bleeds, bleeding from gums, discolored urine, black tarry stools). If you have fallen and hit your head OR if your bleeding is severe or not stopping, seek emergency care.  -Go to the emergency room if emergent signs and symptoms of new clot occur (new or worse swelling and pain in an arm or leg, shortness of breath, chest pain, fast or irregular heartbeats, lightheadedness, dizziness, fainting, coughing up blood) or if you experience a significant color change (pale or blue) in the extremity that has the DVT.   Your next visit is on Tuesday January 7th at 2pm.  [facility] [address] [place], [place], [place] [address] Enter the hospital through Entrance C off [facility] and pull up to the [facility] entrance to the free valet parking.  Check in for your appointment at the [facility].   If you have any questions or need to reschedule an appointment, please call [phone] [facility].  If you are having an emergency, call 911 or present to the nearest emergency room.   What is a DVT?  -Deep vein thrombosis (DVT) is a condition in which a blood clot forms in a vein of the deep venous system which can occur in the lower leg, thigh, pelvis, arm, or neck. This  condition is serious and can be life-threatening if the clot travels to the arteries of the lungs and causing a blockage (pulmonary embolism, PE). A DVT can also damage veins in the leg, which can lead to long-term venous disease, leg pain, swelling, discoloration, and ulcers or sores (post-thrombotic syndrome).  -Treatment may include taking an anticoagulant medication to prevent more clots from forming and the current clot from growing, wearing compression stockings, and/or surgical procedures to remove or dissolve the clot.

## 2023-12-07 NOTE — Telephone Encounter (Signed)
 Notified patient of sleep study results and recommendations. All questions (if any) were answered and patient verbalized understanding. CPAP order placed through [facility] today.

## 2023-12-07 NOTE — Progress Notes (Signed)
 DVT Clinic Note  Name: Katie Daniel     MRN: [id number]     DOB: 1946/04/13     Sex: female  PCP: Teresa Channel, MD  Today's Visit: Visit Information: Follow Up Visit  Referred to DVT Clinic by:  Cardiology [facility])  - Dr. Pietro Referred to CPP by: Dr. Pearline Reason for referral:  Chief Complaint  Patient presents with   Warfarin management   HISTORY OF PRESENT ILLNESS: Katie Daniel is a 78 y.o. female with PMH CAD, aortic atherosclerosis, Parkinson's disease, who presents after diagnosis of DVT for medication management. Patient has a remote questionable history of DVT. In 07/2014 she was found to have an acute DVT involving the left calf veins and started on Xarelto. She was seen by a vein specialist shortly after that and MRI showed no DVT so she was taken off Xarelto. She also has a history of acute superficial vein thrombosis in the left mid calf 09/2020 which was treated with symptom management. She recently developed left medial thigh redness and tenderness and could feel a lump in her medial thigh. No recent injuries, illness, travel, or immobility. She has a history of varicose veins. She admits to being more dehydrated lately. Doppler 11/29/23 showed acute superficial vein thrombosis in the left great saphenous vein from the mid calf up to the saphenofemoral junction, and Dr. Pietro referred the patient to the DVT Clinic. Due to its proximity to the [facility], Dr. Lanis recommending treating with anticoagulation for 3 months. Due to DOAC drug interaction with primidone , warfarin was started on 11/29/23 and bridged with Lovenox . At her last visit, her INR was subtherapeutic so warfarin dose was boosted.   Today, patient reports that her leg continues to feel better. She has not noticed it feeling tender anymore. Denies abnormal bleeding or bruising. Denies missed doses of warfarin or Lovenox . She has been successfully self-administering the Lovenox  injections.   Positive  Thrombotic Risk Factors: Older Age Bleeding Risk Factors: Age >65 years, Anticoagulant therapy  Negative Thrombotic Risk Factors: Previous VTE, Recent surgery (within 3 months), Recent trauma (within 3 months), Recent admission to hospital with acute illness (within 3 months), Paralysis, paresis, or recent plaster cast immobilization of lower extremity, Central venous catheterization, Bed rest >72 hours within 3 months, Sedentary journey lasting >8 hours within 4 weeks, Pregnancy, Recent cesarean section (within 3 months), Within 6 weeks postpartum, Estrogen therapy, Testosterone therapy, Erythropoiesis-stimulating agent, Recent COVID diagnosis (within 3 months), Active cancer, Non-malignant, chronic inflammatory condition, Known thrombophilic condition, Smoking, Obesity  Rx Insurance Coverage: Medicare Rx Affordability: Enoxaparin  (generic) is $10 for a 10 day supply. Warfarin is $4.  Preferred Pharmacy: Filled enoxaparin  and warfarin at [facility] during her initial visit.   Past Medical History:  Diagnosis Date   Agatston coronary artery calcium  score less than 100    34.4 on CT 04/2021   Aortic atherosclerosis (HCC)    noted on chest CT 04/2021   Arthritis    DVT (deep venous thrombosis) (HCC)    Essential tremor    Fam hx-ischem heart disease    Fatigue    Hematoma    OF LEFT CALF   Left arm pain    Osteoarthritis    Osteopenia    Parkinson disease (HCC)    Squamous cell skin cancer    Scalp and side of nose   Vitamin D  deficiency     Past Surgical History:  Procedure Laterality Date   COLONOSCOPY  KNEE ARTHROSCOPY WITH MENISCAL REPAIR Right    MOUTH SURGERY     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   Procedure: TOTAL KNEE ARTHROPLASTY;  Surgeon: Ernie Cough, MD;  Location: [facility];  Service: Orthopedics;  Laterality: Right;  70 mins    Social History   Socioeconomic History   Marital status: Widowed    Spouse name: Not on file   Number  of children: 2   Years of education: 76   Highest education level: Not on file  Occupational History   Occupation: retired  Tobacco Use   Smoking status: Never   Smokeless tobacco: Never  Vaping Use   Vaping status: Never Used  Substance and Sexual Activity   Alcohol use: Yes    Comment: glass of wine occ - 1 time per week   Drug use: Never   Sexual activity: Not on file  Other Topics Concern   Not on file  Social History Narrative   Drinks caffeine   Right handed   One story home   Social Drivers of Health   Financial Resource Strain: Not on file  Food Insecurity: Not on file  Transportation Needs: Not on file  Physical Activity: Not on file  Stress: Not on file  Social Connections: Not on file  Intimate Partner Violence: Not on file    Family History  Problem Relation Age of Onset   Heart attack Mother        Died of MI at 42   Stroke Father    Heart attack Father        MI in his 92's   Breast cancer Child    CAD Paternal Uncle        MI in 62's    Allergies as of 12/07/2023 - Review Complete 12/07/2023  Allergen Reaction Noted   Amoxicillin-pot clavulanate Other (See Comments) 01/25/2021   Latex Other (See Comments) 01/25/2021   Penicillins Hives 03/02/2020    Current Outpatient Medications on File Prior to Encounter  Medication Sig Dispense Refill   warfarin (COUMADIN ) 5 MG tablet Take 1 tablet (5 mg total) by mouth daily. Or as instructed by DVT Clinic. 30 tablet 0   atorvastatin  (LIPITOR) 20 MG tablet Take 1 tablet (20 mg total) by mouth daily. 90 tablet 3   carbidopa -levodopa  (SINEMET  IR) 25-100 MG tablet TAKE 1.5 TABLETS BY MOUTH AT 7AM; 1 TABLET AT 11AM; 1.5 TABLET AT 3PM; AND 1 TABLET AT 7 PM. 450 tablet 0   cholecalciferol (VITAMIN D3) 25 MCG (1000 UNIT) tablet Take 1,000 Units by mouth daily.     glucosamine-chondroitin 500-400 MG tablet Take 1 tablet by mouth 3 (three) times daily.     Opicapone  (ONGENTYS ) 50 MG CAPS Take 1 capsule by mouth daily.  90 capsule 1   primidone  (MYSOLINE ) 50 MG tablet TAKE 1 TABLET(50 MG) BY MOUTH TWICE DAILY 180 tablet 0   rOPINIRole  (REQUIP  XL) 8 MG 24 hr tablet TAKE 1 TABLET(8 MG) BY MOUTH DAILY 90 tablet 1   No current facility-administered medications on file prior to encounter.   REVIEW OF SYSTEMS:  Review of Systems  Respiratory:  Negative for shortness of breath.   Cardiovascular:  Negative for chest pain, palpitations and leg swelling.  Musculoskeletal:  Negative for myalgias.  Neurological:  Negative for dizziness and tingling.   PHYSICAL EXAMINATION:  Physical Exam Vitals reviewed.  Cardiovascular:     Rate and Rhythm: Normal rate.  Pulmonary:     Effort: Pulmonary effort is normal.  Musculoskeletal:        General: Swelling present. No tenderness.  Skin:    Findings: No bruising or erythema.  Psychiatric:        Mood and Affect: Mood normal.        Behavior: Behavior normal.        Thought Content: Thought content normal.   Villalta Score for Post-Thrombotic Syndrome: Pain: Mild Cramps: Mild Heaviness: Mild Paresthesia: Absent Pruritus: Moderate Pretibial Edema: Mild Skin Induration: Absent Hyperpigmentation: Absent Redness: Absent Venous Ectasia: Mild Pain on calf compression: Absent Villalta Preliminary Score: 7 Is venous ulcer present?: No If venous ulcer is present and score is <15, then 15 points total are assigned: Absent Villalta Total Score: 7  LABS:  CBC     Component Value Date/Time   WBC 7.8 09/20/2023 1240   WBC 13.2 (H) 03/17/2020 0341   RBC 4.59 09/20/2023 1240   RBC 3.91 03/17/2020 0341   HGB 13.6 09/20/2023 1240   HCT 42.2 09/20/2023 1240   PLT 250 09/20/2023 1240   MCV 92 09/20/2023 1240   MCH 29.6 09/20/2023 1240   MCH 29.7 03/17/2020 0341   MCHC 32.2 09/20/2023 1240   MCHC 31.2 03/17/2020 0341   RDW 13.5 09/20/2023 1240    Hepatic Function      Component Value Date/Time   PROT 6.7 09/20/2023 1240   ALBUMIN 3.9 09/20/2023 1240   AST  19 09/20/2023 1240   ALT 8 09/20/2023 1240   ALKPHOS 102 09/20/2023 1240   BILITOT 0.3 09/20/2023 1240    Renal Function   Lab Results  Component Value Date   CREATININE 0.79 09/20/2023   CREATININE 0.62 03/17/2020   CREATININE 0.69 03/09/2020    CrCl cannot be calculated (Patient's most recent lab result is older than the maximum 21 days allowed.).   VVS Vascular Lab Studies:  11/29/23 VAS US  LOWER EXTREMITY VENOUS (DVT) Summary:  LEFT:  - Findings consistent with acute superficial vein thrombosis involving the  left great saphenous vein from the mid calf up to the saphenofemoral  junction. Thrombus appears mildly mobile at the [facility].  - There is no evidence of deep vein thrombosis in the lower extremity.   ASSESSMENT: Location of DVT: Left superficial vein  Since the thrombus was within 3 cm of the saphenofemoral junction, vascular surgeon Dr. Lanis advised starting anticoagulation and treating for a total of 3 months for superficial thrombophlebitis. The patient is taking primidone , which is a strong CYP3A4 inducer. Unable to use Xarelto or Eliquis due to primidone  significantly decreasing their concentrations. We initiated warfarin with a Lovenox  bridge on 12/03/23. CrCl is 54.66 mL/min (using Scr 0.79 and ideal body weight), allowing the use of enoxaparin  1.5 mg/kg/day which limits injections to once daily. Primidone  also decreases concentrations of warfarin but we can adjust the dose to ensure she maintains a therapeutic INR with goal of 2-3.   Today, INR is subtherapeutic at 1.6 but starting to move in the right direction after boosting warfarin at her last visit.  Will slightly increase dose of warfarin and continue Lovenox  until INR >2. Will recheck INR next week. She has already experienced significant symptom improvement since last week. No issues with Lovenox  or warfarin.   PLAN: -Increase warfarin to 12.5 mg today and tomorrow then continue 10 mg daily until next INR check.  Continue Lovenox  100 mg daily (1.5 mg/kg/day). -Expected duration of therapy: 3 months. Therapy started on 11/29/23. -Patient educated on purpose, proper use and potential adverse effects of  warfarin (Coumadin ) enoxaparin  (Lovenox ). -Discussed importance of taking medication around the same time every day. -Advised patient of medications to avoid (NSAIDs, aspirin  doses >100 mg daily). -Educated that Tylenol  (acetaminophen ) is the preferred analgesic to lower the risk of bleeding. -Advised patient to alert all providers of anticoagulation therapy prior to starting a new medication or having a procedure. -Emphasized importance of monitoring for signs and symptoms of bleeding (abnormal bruising, prolonged bleeding, nose bleeds, bleeding from gums, discolored urine, black tarry stools). -Educated patient to present to the ED if emergent signs and symptoms of new thrombosis occur. -Counseled patient to wear compression stockings daily, removing at night. Elevate legs to help with swelling.   Follow up: Tuesday for next INR check  Lum Herald, PharmD, BCACP, CPP Deep Vein Thrombosis Clinic Clinical Pharmacist Practitioner Office: [phone]

## 2023-12-10 DIAGNOSIS — C44319 Basal cell carcinoma of skin of other parts of face: Secondary | ICD-10-CM | POA: Diagnosis not present

## 2023-12-10 DIAGNOSIS — D485 Neoplasm of uncertain behavior of skin: Secondary | ICD-10-CM | POA: Diagnosis not present

## 2023-12-11 ENCOUNTER — Ambulatory Visit (HOSPITAL_COMMUNITY)
Admission: RE | Admit: 2023-12-11 | Discharge: 2023-12-11 | Disposition: A | Discharge status: Home / Self Care | Payer: Medicare PPO | Source: Ambulatory Visit | Attending: Surgery | Admitting: Surgery

## 2023-12-11 DIAGNOSIS — I8002 Phlebitis and thrombophlebitis of superficial vessels of left lower extremity: Secondary | ICD-10-CM

## 2023-12-11 DIAGNOSIS — Z7901 Long term (current) use of anticoagulants: Secondary | ICD-10-CM | POA: Diagnosis not present

## 2023-12-11 LAB — POCT INR: INR: 2.6 (ref 2.0–3.0)

## 2023-12-11 MED ORDER — WARFARIN SODIUM 5 MG PO TABS: 10.0000 mg | ORAL_TABLET | Freq: Every day | ORAL | 1 refills | Status: DC

## 2023-12-11 NOTE — Patient Instructions (Signed)
-  Stop Lovenox  injections. Take warfarin 12.5 mg (2.5 tablets) on Friday and 10 mg (2 tablets) all other days.  -Your refills have been sent to your [facility] in [place].  -It is important to take your medication around the same time every day.  -Avoid NSAIDs like ibuprofen (Advil, Motrin) and naproxen (Aleve) as well as aspirin  doses over 100 mg daily. -Tylenol  (acetaminophen ) is the preferred over the counter pain medication to lower the risk of bleeding. -Be sure to alert all of your health care providers that you are taking an anticoagulant prior to starting a new medication or having a procedure. -Monitor for signs and symptoms of bleeding (abnormal bruising, prolonged bleeding, nose bleeds, bleeding from gums, discolored urine, black tarry stools). If you have fallen and hit your head OR if your bleeding is severe or not stopping, seek emergency care.  -Go to the emergency room if emergent signs and symptoms of new clot occur (new or worse swelling and pain in an arm or leg, shortness of breath, chest pain, fast or irregular heartbeats, lightheadedness, dizziness, fainting, coughing up blood) or if you experience a significant color change (pale or blue) in the extremity that has the DVT.   Your next visit is on Tuesday January 14th at 1:30PM.  [facility] [address] [place], [place], [place] [address] Enter the hospital through Entrance C off [facility] and pull up to the [facility] entrance to the free valet parking.  Check in for your appointment at the [facility].   If you have any questions or need to reschedule an appointment, please call [phone] [facility].  If you are having an emergency, call 911 or present to the nearest emergency room.   What is a DVT?  -Deep vein thrombosis (DVT) is a condition in which a blood clot forms in a vein of the deep venous system which can occur in the lower leg, thigh, pelvis, arm, or neck.  This condition is serious and can be life-threatening if the clot travels to the arteries of the lungs and causing a blockage (pulmonary embolism, PE). A DVT can also damage veins in the leg, which can lead to long-term venous disease, leg pain, swelling, discoloration, and ulcers or sores (post-thrombotic syndrome).  -Treatment may include taking an anticoagulant medication to prevent more clots from forming and the current clot from growing, wearing compression stockings, and/or surgical procedures to remove or dissolve the clot.

## 2023-12-11 NOTE — Progress Notes (Addendum)
 DVT Clinic Note  Name: Katie Daniel     MRN: [id number]     DOB: 07-Dec-1945     Sex: female  PCP: Teresa Channel, MD  Today's Visit: Visit Information: Follow Up Visit  Referred to DVT Clinic by:  Cardiology [facility])  - Dr. Pietro Referred to CPP by: Dr. Serene Reason for referral:  Chief Complaint  Patient presents with   Warfarin management   HISTORY OF PRESENT ILLNESS: Katie Daniel is a 78 y.o. female with PMH CAD, aortic atherosclerosis, Parkinson's disease, who presents after diagnosis of DVT for medication management. Patient has a remote questionable history of DVT. In 07/2014 she was found to have an acute DVT involving the left calf veins and started on Xarelto. She was seen by a vein specialist shortly after that and MRI showed no DVT so she was taken off Xarelto. She also has a history of acute superficial vein thrombosis in the left mid calf 09/2020 which was treated with symptom management. She recently developed left medial thigh redness and tenderness and could feel a lump in her medial thigh. No recent injuries, illness, travel, or immobility. She has a history of varicose veins. She admits to being more dehydrated lately. Doppler 11/29/23 showed acute superficial vein thrombosis in the left great saphenous vein from the mid calf up to the saphenofemoral junction, and Dr. Pietro referred the patient to the DVT Clinic. Due to its proximity to the [facility], Dr. Lanis recommending treating with anticoagulation for 3 months. Due to DOAC drug interaction with primidone , warfarin was started on 11/29/23 and bridged with Lovenox . At her last visit, her INR was still subtherapeutic so warfarin dose was boosted.   Today, patient reports that her leg continues to feel better. She has not noticed it feeling tender anymore. Denies abnormal bleeding or bruising. Denies missed doses of warfarin or Lovenox . She has been successfully self-administering the Lovenox  injections.   Positive  Thrombotic Risk Factors: Older Age Bleeding Risk Factors: Anticoagulant therapy, Age >65 years  Negative Thrombotic Risk Factors: Previous VTE, Recent surgery (within 3 months), Recent trauma (within 3 months), Recent admission to hospital with acute illness (within 3 months), Paralysis, paresis, or recent plaster cast immobilization of lower extremity, Central venous catheterization, Bed rest >72 hours within 3 months, Sedentary journey lasting >8 hours within 4 weeks, Pregnancy, Within 6 weeks postpartum, Recent cesarean section (within 3 months), Estrogen therapy, Testosterone therapy, Erythropoiesis-stimulating agent, Recent COVID diagnosis (within 3 months), Active cancer, Non-malignant, chronic inflammatory condition, Known thrombophilic condition, Smoking, Obesity  Rx Insurance Coverage: Medicare Rx Affordability: Enoxaparin  (generic) is $10 for a 10 day supply. Warfarin is $4.  Preferred Pharmacy: Filled enoxaparin  and warfarin at [facility] during her initial visit.   Past Medical History:  Diagnosis Date   Agatston coronary artery calcium  score less than 100    34.4 on CT 04/2021   Aortic atherosclerosis (HCC)    noted on chest CT 04/2021   Arthritis    DVT (deep venous thrombosis) (HCC)    Essential tremor    Fam hx-ischem heart disease    Fatigue    Hematoma    OF LEFT CALF   Left arm pain    Osteoarthritis    Osteopenia    Parkinson disease (HCC)    Squamous cell skin cancer    Scalp and side of nose   Vitamin D  deficiency     Past Surgical History:  Procedure Laterality Date   COLONOSCOPY  KNEE ARTHROSCOPY WITH MENISCAL REPAIR Right    MOUTH SURGERY     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   Procedure: TOTAL KNEE ARTHROPLASTY;  Surgeon: Ernie Cough, MD;  Location: [facility];  Service: Orthopedics;  Laterality: Right;  70 mins    Social History   Socioeconomic History   Marital status: Widowed    Spouse name: Not on file   Number  of children: 2   Years of education: 53   Highest education level: Not on file  Occupational History   Occupation: retired  Tobacco Use   Smoking status: Never   Smokeless tobacco: Never  Vaping Use   Vaping status: Never Used  Substance and Sexual Activity   Alcohol use: Yes    Comment: glass of wine occ - 1 time per week   Drug use: Never   Sexual activity: Not on file  Other Topics Concern   Not on file  Social History Narrative   Drinks caffeine   Right handed   One story home   Social Drivers of Health   Financial Resource Strain: Not on file  Food Insecurity: Not on file  Transportation Needs: Not on file  Physical Activity: Not on file  Stress: Not on file  Social Connections: Not on file  Intimate Partner Violence: Not on file    Family History  Problem Relation Age of Onset   Heart attack Mother        Died of MI at 80   Stroke Father    Heart attack Father        MI in his 53's   Breast cancer Child    CAD Paternal Uncle        MI in 39's    Allergies as of 12/11/2023 - Review Complete 12/11/2023  Allergen Reaction Noted   Amoxicillin-pot clavulanate Other (See Comments) 01/25/2021   Latex Other (See Comments) 01/25/2021   Penicillins Hives 03/02/2020    Current Outpatient Medications on File Prior to Encounter  Medication Sig Dispense Refill   enoxaparin  (LOVENOX ) 100 MG/ML injection Inject 1 mL (100 mg total) into the skin daily. Until INR >2. 10 mL 0   atorvastatin  (LIPITOR) 20 MG tablet Take 1 tablet (20 mg total) by mouth daily. 90 tablet 3   carbidopa -levodopa  (SINEMET  IR) 25-100 MG tablet TAKE 1.5 TABLETS BY MOUTH AT 7AM; 1 TABLET AT 11AM; 1.5 TABLET AT 3PM; AND 1 TABLET AT 7 PM. 450 tablet 0   cholecalciferol (VITAMIN D3) 25 MCG (1000 UNIT) tablet Take 1,000 Units by mouth daily.     glucosamine-chondroitin 500-400 MG tablet Take 1 tablet by mouth 3 (three) times daily.     Opicapone  (ONGENTYS ) 50 MG CAPS Take 1 capsule by mouth daily. 90  capsule 1   primidone  (MYSOLINE ) 50 MG tablet TAKE 1 TABLET(50 MG) BY MOUTH TWICE DAILY 180 tablet 0   rOPINIRole  (REQUIP  XL) 8 MG 24 hr tablet TAKE 1 TABLET(8 MG) BY MOUTH DAILY 90 tablet 1   No current facility-administered medications on file prior to encounter.   REVIEW OF SYSTEMS:  Review of Systems  Respiratory:  Negative for shortness of breath.   Cardiovascular:  Negative for chest pain, palpitations and leg swelling.  Musculoskeletal:  Negative for myalgias.  Neurological:  Negative for dizziness and tingling.   PHYSICAL EXAMINATION:  Physical Exam Vitals reviewed.  Cardiovascular:     Rate and Rhythm: Normal rate.  Pulmonary:     Effort: Pulmonary effort is normal.  Musculoskeletal:  General: Swelling present. No tenderness.  Skin:    Findings: No bruising or erythema.  Psychiatric:        Mood and Affect: Mood normal.        Behavior: Behavior normal.        Thought Content: Thought content normal.   Villalta Score for Post-Thrombotic Syndrome: Pain: Mild Cramps: Mild Heaviness: Absent Paresthesia: Absent Pruritus: Absent Pretibial Edema: Absent Skin Induration: Absent Hyperpigmentation: Absent Redness: Absent Venous Ectasia: Mild Pain on calf compression: Absent Villalta Preliminary Score: 3 Is venous ulcer present?: No If venous ulcer is present and score is <15, then 15 points total are assigned: Absent Villalta Total Score: 3  LABS:  CBC     Component Value Date/Time   WBC 7.8 09/20/2023 1240   WBC 13.2 (H) 03/17/2020 0341   RBC 4.59 09/20/2023 1240   RBC 3.91 03/17/2020 0341   HGB 13.6 09/20/2023 1240   HCT 42.2 09/20/2023 1240   PLT 250 09/20/2023 1240   MCV 92 09/20/2023 1240   MCH 29.6 09/20/2023 1240   MCH 29.7 03/17/2020 0341   MCHC 32.2 09/20/2023 1240   MCHC 31.2 03/17/2020 0341   RDW 13.5 09/20/2023 1240    Hepatic Function      Component Value Date/Time   PROT 6.7 09/20/2023 1240   ALBUMIN 3.9 09/20/2023 1240   AST 19  09/20/2023 1240   ALT 8 09/20/2023 1240   ALKPHOS 102 09/20/2023 1240   BILITOT 0.3 09/20/2023 1240    Renal Function   Lab Results  Component Value Date   CREATININE 0.79 09/20/2023   CREATININE 0.62 03/17/2020   CREATININE 0.69 03/09/2020    CrCl cannot be calculated (Patient's most recent lab result is older than the maximum 21 days allowed.).   VVS Vascular Lab Studies:  11/29/23 VAS US  LOWER EXTREMITY VENOUS (DVT) Summary:  LEFT:  - Findings consistent with acute superficial vein thrombosis involving the  left great saphenous vein from the mid calf up to the saphenofemoral  junction. Thrombus appears mildly mobile at the [facility].  - There is no evidence of deep vein thrombosis in the lower extremity.   ASSESSMENT: Location of DVT: Left superficial vein  Since the thrombus was within 3 cm of the saphenofemoral junction, vascular surgeon Dr. Lanis advised starting anticoagulation and treating for a total of 3 months for superficial thrombophlebitis. The patient is taking primidone , which is a strong CYP3A4 inducer. Unable to use Xarelto or Eliquis due to primidone  significantly decreasing their concentrations. We initiated warfarin with a Lovenox  bridge on 12/03/23, and she was able to discontinue Lovenox  on 12/11/23 with INR >2. Primidone  also decreases concentrations of warfarin but we can adjust the dose to ensure she maintains a therapeutic INR with goal of 2-3.   Today, INR is now therapeutic at 2.6 after boosting. With her INR now >2, will discontinue Lovenox  bridge. She received 75 mg of warfarin in the past 7 days that increased her INR from 1.1 to 2.6. Will have her take a slightly lower weekly dose than this - 10 mg daily except for 12.5 mg on Fridays and recheck next week to see if this maintains her INR in range.   PLAN: -Discontinue Lovenox .  -Take warfarin 12.5 mg on Friday and 10 mg all other days.  -Expected duration of therapy: 3 months. Therapy started on  11/29/23. -Patient educated on purpose, proper use and potential adverse effects of warfarin (Coumadin ) enoxaparin  (Lovenox ). -Discussed importance of taking medication around the same  time every day. -Advised patient of medications to avoid (NSAIDs, aspirin  doses >100 mg daily). -Educated that Tylenol  (acetaminophen ) is the preferred analgesic to lower the risk of bleeding. -Advised patient to alert all providers of anticoagulation therapy prior to starting a new medication or having a procedure. -Emphasized importance of monitoring for signs and symptoms of bleeding (abnormal bruising, prolonged bleeding, nose bleeds, bleeding from gums, discolored urine, black tarry stools). -Educated patient to present to the ED if emergent signs and symptoms of new thrombosis occur. -Counseled patient to wear compression stockings daily, removing at night. Elevate legs to help with swelling.   Follow up: 1 week for next INR check  Lum Herald, PharmD, BCACP, CPP Deep Vein Thrombosis Clinic Clinical Pharmacist Practitioner Office: [phone]   I have evaluated the patient's chart/imaging and refer this patient to the Clinical Pharmacist Practitioner for medication management. I have reviewed the CPP's documentation and agree with her assessment and plan. I was immediately available during the visit for questions and collaboration.   Malvina New, MD

## 2023-12-18 ENCOUNTER — Ambulatory Visit (HOSPITAL_COMMUNITY)
Admission: RE | Admit: 2023-12-18 | Discharge: 2023-12-18 | Disposition: A | Discharge status: Home / Self Care | Payer: Medicare PPO | Source: Ambulatory Visit | Attending: Vascular Surgery | Admitting: Vascular Surgery

## 2023-12-18 DIAGNOSIS — Z7901 Long term (current) use of anticoagulants: Secondary | ICD-10-CM | POA: Insufficient documentation

## 2023-12-18 DIAGNOSIS — G4733 Obstructive sleep apnea (adult) (pediatric): Secondary | ICD-10-CM | POA: Diagnosis not present

## 2023-12-18 DIAGNOSIS — I8002 Phlebitis and thrombophlebitis of superficial vessels of left lower extremity: Secondary | ICD-10-CM | POA: Insufficient documentation

## 2023-12-18 LAB — POCT INR: INR: 3.4 — AB (ref 2.0–3.0)

## 2023-12-18 NOTE — Patient Instructions (Signed)
-  Take warfarin 5 mg (1 tablet) today and have a serving of greens today. Then continue warfarin 12.5 mg (2.5 tablets) on Friday and 10 mg (2 tablets) all other days.  -It is important to take your medication around the same time every day.  -Avoid NSAIDs like ibuprofen (Advil, Motrin) and naproxen (Aleve) as well as aspirin  doses over 100 mg daily. -Tylenol  (acetaminophen ) is the preferred over the counter pain medication to lower the risk of bleeding. -Be sure to alert all of your health care providers that you are taking an anticoagulant prior to starting a new medication or having a procedure. -Monitor for signs and symptoms of bleeding (abnormal bruising, prolonged bleeding, nose bleeds, bleeding from gums, discolored urine, black tarry stools). If you have fallen and hit your head OR if your bleeding is severe or not stopping, seek emergency care.  -Go to the emergency room if emergent signs and symptoms of new clot occur (new or worse swelling and pain in an arm or leg, shortness of breath, chest pain, fast or irregular heartbeats, lightheadedness, dizziness, fainting, coughing up blood) or if you experience a significant color change (pale or blue) in the extremity that has the DVT.  -We recommend you wear compression stockings as long as you are having swelling or pain. Be sure to purchase the correct size and take them off at night.   Your next visit is on 12/27/23 at 2:30 PM.  [facility] [address] [place], [place], [place] [address] Enter the hospital through Entrance C off [facility] and pull up to the [facility] entrance to the free valet parking.  Check in for your appointment at the [facility].   If you have any questions or need to reschedule an appointment, please call [phone] [facility].  If you are having an emergency, call 911 or present to the nearest emergency room.   What is a DVT?  -Deep vein thrombosis (DVT) is a  condition in which a blood clot forms in a vein of the deep venous system which can occur in the lower leg, thigh, pelvis, arm, or neck. This condition is serious and can be life-threatening if the clot travels to the arteries of the lungs and causing a blockage (pulmonary embolism, PE). A DVT can also damage veins in the leg, which can lead to long-term venous disease, leg pain, swelling, discoloration, and ulcers or sores (post-thrombotic syndrome).  -Treatment may include taking an anticoagulant medication to prevent more clots from forming and the current clot from growing, wearing compression stockings, and/or surgical procedures to remove or dissolve the clot.

## 2023-12-18 NOTE — Progress Notes (Addendum)
 DVT Clinic Note  Name: Katie Daniel     MRN: [id number]     DOB: 23-Jan-1946     Sex: female  PCP: Teresa Channel, MD  Today's Visit: Visit Information: Follow Up Visit  Referred to DVT Clinic by:  Cardiology [facility])  - Dr. Pietro Referred to CPP by: Dr. Pearline Reason for referral:  Chief Complaint  Patient presents with   Warfarin management   HISTORY OF PRESENT ILLNESS: Katie Daniel is a 78 y.o. female with PMH CAD, aortic atherosclerosis, Parkinson's disease, who presents after diagnosis of DVT for medication management. Patient has a remote questionable history of DVT. In 07/2014 she was found to have an acute DVT involving the left calf veins and started on Xarelto. She was seen by a vein specialist shortly after that and MRI showed no DVT so she was taken off Xarelto. She also has a history of acute superficial vein thrombosis in the left mid calf 09/2020 which was treated with symptom management. She recently developed left medial thigh redness and tenderness and could feel a lump in her medial thigh. No recent injuries, illness, travel, or immobility. She has a history of varicose veins. She admits to being more dehydrated lately. Doppler 11/29/23 showed acute superficial vein thrombosis in the left great saphenous vein from the mid calf up to the saphenofemoral junction, and Dr. Pietro referred the patient to the DVT Clinic. Due to its proximity to the [facility], Dr. Lanis recommending treating with anticoagulation for 3 months. Due to DOAC drug interaction with primidone , warfarin was started on 11/29/23 and bridged with Lovenox . At her last visit, her INR was therapeutic at 2.6 and Lovenox  injections were discontinued.   Today, patient reports that her leg continues to feel better. Tenderness continues to improve. Denies abnormal bleeding or bruising. Denies missed doses of warfarin.   Positive Thrombotic Risk Factors: Older Age Bleeding Risk Factors: Age >65 years,  Anticoagulant therapy  Negative Thrombotic Risk Factors: Recent surgery (within 3 months), Previous VTE, Recent trauma (within 3 months), Recent admission to hospital with acute illness (within 3 months), Paralysis, paresis, or recent plaster cast immobilization of lower extremity, Sedentary journey lasting >8 hours within 4 weeks, Central venous catheterization, Bed rest >72 hours within 3 months, Pregnancy, Within 6 weeks postpartum, Recent cesarean section (within 3 months), Estrogen therapy, Testosterone therapy, Erythropoiesis-stimulating agent, Recent COVID diagnosis (within 3 months), Active cancer, Non-malignant, chronic inflammatory condition, Known thrombophilic condition, Smoking, Obesity  Rx Insurance Coverage: Medicare Rx Affordability: Warfarin is $4.  Preferred Pharmacy: Filled enoxaparin  and warfarin at [facility] during her initial visit. Warfarin refills have since been sent to [facility] in [place], [place].   Past Medical History:  Diagnosis Date   Agatston coronary artery calcium  score less than 100    34.4 on CT 04/2021   Aortic atherosclerosis (HCC)    noted on chest CT 04/2021   Arthritis    DVT (deep venous thrombosis) (HCC)    Essential tremor    Fam hx-ischem heart disease    Fatigue    Hematoma    OF LEFT CALF   Left arm pain    Osteoarthritis    Osteopenia    Parkinson disease (HCC)    Squamous cell skin cancer    Scalp and side of nose   Vitamin D  deficiency     Past Surgical History:  Procedure Laterality Date   COLONOSCOPY     KNEE ARTHROSCOPY WITH MENISCAL REPAIR Right  MOUTH SURGERY     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   Procedure: TOTAL KNEE ARTHROPLASTY;  Surgeon: Ernie Cough, MD;  Location: [facility];  Service: Orthopedics;  Laterality: Right;  70 mins    Social History   Socioeconomic History   Marital status: Widowed    Spouse name: Not on file   Number of children: 2   Years of education: 62   Highest  education level: Not on file  Occupational History   Occupation: retired  Tobacco Use   Smoking status: Never   Smokeless tobacco: Never  Vaping Use   Vaping status: Never Used  Substance and Sexual Activity   Alcohol use: Yes    Comment: glass of wine occ - 1 time per week   Drug use: Never   Sexual activity: Not on file  Other Topics Concern   Not on file  Social History Narrative   Drinks caffeine   Right handed   One story home   Social Drivers of Health   Financial Resource Strain: Not on file  Food Insecurity: Not on file  Transportation Needs: Not on file  Physical Activity: Not on file  Stress: Not on file  Social Connections: Not on file  Intimate Partner Violence: Not on file    Family History  Problem Relation Age of Onset   Heart attack Mother        Died of MI at 47   Stroke Father    Heart attack Father        MI in his 65's   Breast cancer Child    CAD Paternal Uncle        MI in 62's    Allergies as of 12/18/2023 - Review Complete 12/18/2023  Allergen Reaction Noted   Amoxicillin-pot clavulanate Other (See Comments) 01/25/2021   Latex Other (See Comments) 01/25/2021   Penicillins Hives 03/02/2020    Current Outpatient Medications on File Prior to Encounter  Medication Sig Dispense Refill   atorvastatin  (LIPITOR) 20 MG tablet Take 1 tablet (20 mg total) by mouth daily. 90 tablet 3   carbidopa -levodopa  (SINEMET  IR) 25-100 MG tablet TAKE 1.5 TABLETS BY MOUTH AT 7AM; 1 TABLET AT 11AM; 1.5 TABLET AT 3PM; AND 1 TABLET AT 7 PM. 450 tablet 0   cholecalciferol (VITAMIN D3) 25 MCG (1000 UNIT) tablet Take 1,000 Units by mouth daily.     glucosamine-chondroitin 500-400 MG tablet Take 1 tablet by mouth 3 (three) times daily.     Opicapone  (ONGENTYS ) 50 MG CAPS Take 1 capsule by mouth daily. 90 capsule 1   primidone  (MYSOLINE ) 50 MG tablet TAKE 1 TABLET(50 MG) BY MOUTH TWICE DAILY 180 tablet 0   rOPINIRole  (REQUIP  XL) 8 MG 24 hr tablet TAKE 1 TABLET(8 MG) BY  MOUTH DAILY 90 tablet 1   warfarin (COUMADIN ) 5 MG tablet Take 2 tablets (10 mg total) by mouth daily. Or as directed by DVT Clinic. 60 tablet 1   No current facility-administered medications on file prior to encounter.   REVIEW OF SYSTEMS:  Review of Systems  Respiratory:  Negative for shortness of breath.   Cardiovascular:  Negative for chest pain, palpitations and leg swelling.  Musculoskeletal:  Negative for myalgias.  Neurological:  Negative for dizziness and tingling.   PHYSICAL EXAMINATION:  Physical Exam Vitals reviewed.  Cardiovascular:     Rate and Rhythm: Normal rate.  Pulmonary:     Effort: Pulmonary effort is normal.  Musculoskeletal:        General:  No swelling or tenderness.  Skin:    Findings: No bruising or erythema.  Psychiatric:        Mood and Affect: Mood normal.        Behavior: Behavior normal.        Thought Content: Thought content normal.   Villalta Score for Post-Thrombotic Syndrome: Pain: Mild Cramps: Absent Heaviness: Absent Paresthesia: Absent Pruritus: Mild Pretibial Edema: Absent Skin Induration: Absent Hyperpigmentation: Absent Redness: Absent Venous Ectasia: Absent Pain on calf compression: Absent Villalta Preliminary Score: 2 If venous ulcer is present and score is <15, then 15 points total are assigned: Absent Villalta Total Score: 2  LABS:  CBC     Component Value Date/Time   WBC 7.8 09/20/2023 1240   WBC 13.2 (H) 03/17/2020 0341   RBC 4.59 09/20/2023 1240   RBC 3.91 03/17/2020 0341   HGB 13.6 09/20/2023 1240   HCT 42.2 09/20/2023 1240   PLT 250 09/20/2023 1240   MCV 92 09/20/2023 1240   MCH 29.6 09/20/2023 1240   MCH 29.7 03/17/2020 0341   MCHC 32.2 09/20/2023 1240   MCHC 31.2 03/17/2020 0341   RDW 13.5 09/20/2023 1240    Hepatic Function      Component Value Date/Time   PROT 6.7 09/20/2023 1240   ALBUMIN 3.9 09/20/2023 1240   AST 19 09/20/2023 1240   ALT 8 09/20/2023 1240   ALKPHOS 102 09/20/2023 1240    BILITOT 0.3 09/20/2023 1240    Renal Function   Lab Results  Component Value Date   CREATININE 0.79 09/20/2023   CREATININE 0.62 03/17/2020   CREATININE 0.69 03/09/2020    CrCl cannot be calculated (Patient's most recent lab result is older than the maximum 21 days allowed.).   [facility] Vascular Lab Studies:  11/29/23 VAS US  LOWER EXTREMITY VENOUS (DVT) Summary:  LEFT:  - Findings consistent with acute superficial vein thrombosis involving the  left great saphenous vein from the mid calf up to the saphenofemoral  junction. Thrombus appears mildly mobile at the [facility].  - There is no evidence of deep vein thrombosis in the lower extremity.   ASSESSMENT: Location of DVT: Left superficial vein  Since the thrombus was within 3 cm of the saphenofemoral junction, vascular surgeon Dr. Lanis advised starting anticoagulation and treating for a total of 3 months for superficial thrombophlebitis. The patient is taking primidone , which is a strong CYP3A4 inducer. Unable to use Xarelto or Eliquis due to primidone  significantly decreasing their concentrations. We initiated warfarin with a Lovenox  bridge on 12/03/23, and she was able to discontinue Lovenox  on 12/11/23 with INR >2. Primidone  also decreases concentrations of warfarin but we can adjust the dose to ensure she maintains a therapeutic INR with goal of 2-3.   Today, INR is now slightly supratherapeutic at 3.4 after adjusting regimen to 72.5 mg of warfarin weekly last week. Patient denies s/sx of bleeding. Patient reports that she has been avoiding all greens, but would like to incorporate 2 servings/week into her diet. Considering anticipated increase in vitamin K intake, will have pt take 1 tablet (5 mg) today and have a serving of broccoli tonight, then resume maintenance regimen - 10 mg daily except for 12.5 mg on Fridays. Will recheck INR next week.   PLAN: -Take warfarin 5 mg (1 tablet) today and have a serving of greens today. Then continue  warfarin 12.5 mg (2.5 tablets) on Friday and 10 mg (2 tablets) all other days.  - Plan to incorporate 2 servings of greens per  week moving forward.  -Expected duration of therapy: 3 months. Therapy started on 11/29/23. -Patient educated on purpose, proper use and potential adverse effects of warfarin (Coumadin ) enoxaparin  (Lovenox ). -Discussed importance of taking medication around the same time every day. -Advised patient of medications to avoid (NSAIDs, aspirin  doses >100 mg daily). -Educated that Tylenol  (acetaminophen ) is the preferred analgesic to lower the risk of bleeding. -Advised patient to alert all providers of anticoagulation therapy prior to starting a new medication or having a procedure. -Emphasized importance of monitoring for signs and symptoms of bleeding (abnormal bruising, prolonged bleeding, nose bleeds, bleeding from gums, discolored urine, black tarry stools). -Educated patient to present to the ED if emergent signs and symptoms of new thrombosis occur. -Counseled patient to wear compression stockings daily, removing at night. Elevate legs to help with swelling.   Follow up: 12/27/23 (~1 week) for INR recheck  Lorain Baseman, PharmD PGY1 Pharmacy Resident  Lum Herald, PharmD, BCACP, CPP Deep [facility]in [facility]rombosis [facility] Clinical Pharmacist Practitioner Office: [phone]

## 2023-12-19 NOTE — Telephone Encounter (Signed)
 Just checking the status of sleep study?  It's been completed, has it been read?

## 2023-12-19 NOTE — Telephone Encounter (Signed)
 Per Brandie R. Notified patient of sleep study results and recommendations. All questions (if any) were answered and patient verbalized understanding. CPAP order placed through [employer] today.  Patient notified today

## 2023-12-24 ENCOUNTER — Ambulatory Visit (HOSPITAL_COMMUNITY)
Admission: RE | Admit: 2023-12-24 | Discharge: 2023-12-24 | Disposition: A | Discharge status: Home / Self Care | Payer: Medicare PPO | Source: Ambulatory Visit | Attending: Vascular Surgery | Admitting: Vascular Surgery

## 2023-12-24 DIAGNOSIS — I8002 Phlebitis and thrombophlebitis of superficial vessels of left lower extremity: Secondary | ICD-10-CM | POA: Diagnosis not present

## 2023-12-24 DIAGNOSIS — Z7901 Long term (current) use of anticoagulants: Secondary | ICD-10-CM | POA: Diagnosis not present

## 2023-12-24 LAB — POCT INR: INR: 3.5 — AB (ref 2.0–3.0)

## 2023-12-24 NOTE — Patient Instructions (Signed)
Don't take any warfarin today (12/24/23). Tomorrow, start taking 10 mg of warfarin (2 tablets) daily until you see me next. You can eat 2 servings of greens per week.   -It is important to take your medication around the same time every day.  -Avoid NSAIDs like ibuprofen (Advil, Motrin) and naproxen (Aleve) as well as aspirin doses over 100 mg daily. -Tylenol (acetaminophen) is the preferred over the counter pain medication to lower the risk of bleeding. -Be sure to alert all of your health care providers that you are taking an anticoagulant prior to starting a new medication or having a procedure. -Monitor for signs and symptoms of bleeding (abnormal bruising, prolonged bleeding, nose bleeds, bleeding from gums, discolored urine, black tarry stools). If you have fallen and hit your head OR if your bleeding is severe or not stopping, seek emergency care.  -Go to the emergency room if emergent signs and symptoms of new clot occur (new or worse swelling and pain in an arm or leg, shortness of breath, chest pain, fast or irregular heartbeats, lightheadedness, dizziness, fainting, coughing up blood) or if you experience a significant color change (pale or blue) in the extremity that has the DVT.   Your next visit is on Thursday January 30th at [facility] [address] [place], [place], [place] [address] Enter the hospital through Entrance C off [facility] and pull up to the [facility] entrance to the free valet parking.  Check in for your appointment at the [facility].   If you have any questions or need to reschedule an appointment, please call [phone] [facility].  If you are having an emergency, call 911 or present to the nearest emergency room.   What is a DVT?  -Deep vein thrombosis (DVT) is a condition in which a blood clot forms in a vein of the deep venous system which can occur in the lower leg, thigh, pelvis, arm, or neck. This  condition is serious and can be life-threatening if the clot travels to the arteries of the lungs and causing a blockage (pulmonary embolism, PE). A DVT can also damage veins in the leg, which can lead to long-term venous disease, leg pain, swelling, discoloration, and ulcers or sores (post-thrombotic syndrome).  -Treatment may include taking an anticoagulant medication to prevent more clots from forming and the current clot from growing, wearing compression stockings, and/or surgical procedures to remove or dissolve the clot.

## 2023-12-24 NOTE — Progress Notes (Signed)
DVT Clinic Note  Name: Katie Daniel     MRN: [id number]     DOB: 19-Dec-1945     Sex: female  PCP: Laurann Montana, MD  Today's Visit: Visit Information: Follow Up Visit  Referred to DVT Clinic by:  Cardiology [facility])  - Dr. Jens Som Referred to CPP by: Dr. Hetty Blend Reason for referral:  Chief Complaint  Patient presents with   Warfarin management   HISTORY OF PRESENT ILLNESS: Katie Daniel is a 78 y.o. female with PMH CAD, aortic atherosclerosis, Parkinson's disease, who presents after diagnosis of DVT for medication management. Patient has a remote questionable history of DVT. In 07/2014 she was found to have an acute DVT involving the left calf veins and started on Xarelto. She was seen by a vein specialist shortly after that and MRI showed no DVT so she was taken off Xarelto. She also has a history of acute superficial vein thrombosis in the left mid calf 09/2020 which was treated with symptom management. She recently developed left medial thigh redness and tenderness and could feel a lump in her medial thigh. No recent injuries, illness, travel, or immobility. She has a history of varicose veins. She admits to being more dehydrated lately. Doppler 11/29/23 showed acute superficial vein thrombosis in the left great saphenous vein from the mid calf up to the saphenofemoral junction, and Dr. Jens Som referred the patient to the DVT Clinic. Due to its proximity to the [facility], Dr. Karin Lieu recommending treating with anticoagulation for 3 months. Due to DOAC drug interaction with primidone, warfarin was started on 11/29/23 and bridged with Lovenox. We have been continuing to monitor her INR closely to determine her appropriate dose.   Today, patient reports that her leg continues to feel better. Tenderness continues to improve. Denies abnormal bleeding or bruising. Denies missed doses of warfarin. We had planned for her to have a couple servings of greens last week because she wanted to work greens  back into her diet, but she only had 1 serving last Tuesday.   Positive Thrombotic Risk Factors: Older Age Bleeding Risk Factors: Age >65 years, Anticoagulant therapy  Negative Thrombotic Risk Factors: Recent surgery (within 3 months), Previous VTE, Recent trauma (within 3 months), Recent admission to hospital with acute illness (within 3 months), Paralysis, paresis, or recent plaster cast immobilization of lower extremity, Central venous catheterization, Bed rest >72 hours within 3 months, Sedentary journey lasting >8 hours within 4 weeks, Pregnancy, Within 6 weeks postpartum, Recent cesarean section (within 3 months), Estrogen therapy, Testosterone therapy, Erythropoiesis-stimulating agent, Recent COVID diagnosis (within 3 months), Active cancer, Non-malignant, chronic inflammatory condition, Known thrombophilic condition, Smoking, Obesity  Rx Insurance Coverage: Medicare Rx Affordability: Warfarin is $4.  Preferred Pharmacy: Filled enoxaparin and warfarin at [facility] during her initial visit. Warfarin refills have since been sent to [facility] in [place], [place].   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    34.4 on CT 04/2021   Aortic atherosclerosis (HCC)    noted on chest CT 04/2021   Arthritis    DVT (deep venous thrombosis) (HCC)    Essential tremor    Fam hx-ischem heart disease    Fatigue    Hematoma    OF LEFT CALF   Left arm pain    Osteoarthritis    Osteopenia    Parkinson disease (HCC)    Squamous cell skin cancer    Scalp and side  of nose   Vitamin D deficiency     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   Procedure: TOTAL KNEE ARTHROPLASTY;  Surgeon: Durene Romans, MD;  Location: [facility];  Service: Orthopedics;  Laterality: Right;  70 mins    Social  History   Socioeconomic History   Marital status: Widowed    Spouse name: Not on file   Number of children: 2   Years of education: 51   Highest education level: Not on file  Occupational History   Occupation: retired  Tobacco Use   Smoking status: Never   Smokeless tobacco: Never  Vaping Use   Vaping status: Never Used  Substance and Sexual Activity   Alcohol use: Yes    Comment: glass of wine occ - 1 time per week   Drug use: Never   Sexual activity: Not on file  Other Topics Concern   Not on file  Social History Narrative   Drinks caffeine   Right handed   One story home   Social Drivers of Health   Financial Resource Strain: Not on file  Food Insecurity: Not on file  Transportation Needs: Not on file  Physical Activity: Not on file  Stress: Not on file  Social Connections: Not on file  Intimate Partner Violence: Not on file    Family History  Problem Relation Age of Onset   Heart attack Mother        Died of MI at 22   Stroke Father    Heart attack Father        MI in his 41's   Breast cancer Child    CAD Paternal Uncle        MI in 61's    Allergies as of 12/24/2023 - Review Complete 12/24/2023  Allergen Reaction Noted   Amoxicillin-pot clavulanate Other (See Comments) 01/25/2021   Latex Other (See Comments) 01/25/2021   Penicillins Hives 03/02/2020    Current Outpatient Medications on File Prior to Encounter  Medication Sig Dispense Refill   atorvastatin (LIPITOR) 20 MG tablet Take 1 tablet (20 mg total) by mouth daily. 90 tablet 3   carbidopa-levodopa (SINEMET IR) 25-100 MG tablet TAKE 1.5 TABLETS BY MOUTH AT 7AM; 1 TABLET AT 11AM; 1.5 TABLET AT 3PM; AND 1 TABLET AT 7 PM. 450 tablet 0   cholecalciferol (VITAMIN D3) 25 MCG (1000 UNIT) tablet Take 1,000 Units by mouth daily.     glucosamine-chondroitin 500-400 MG tablet Take 1 tablet by mouth 3 (three) times daily.     Opicapone (ONGENTYS) 50 MG CAPS Take 1 capsule by mouth daily. 90 capsule 1    primidone (MYSOLINE) 50 MG tablet TAKE 1 TABLET(50 MG) BY MOUTH TWICE DAILY 180 tablet 0   rOPINIRole (REQUIP XL) 8 MG 24 hr tablet TAKE 1 TABLET(8 MG) BY MOUTH DAILY 90 tablet 1   warfarin (COUMADIN) 5 MG tablet Take 2 tablets (10 mg total) by mouth daily. Or as directed by DVT Clinic. 60 tablet 1   No current facility-administered medications on file prior to encounter.   REVIEW OF SYSTEMS:  Review of Systems  Respiratory:  Negative for shortness of breath.   Cardiovascular:  Negative for chest pain, palpitations and leg swelling.  Musculoskeletal:  Negative for myalgias.  Neurological:  Negative for dizziness and tingling.   PHYSICAL EXAMINATION:  Physical Exam  Vitals reviewed.  Cardiovascular:     Rate and Rhythm: Normal rate.  Pulmonary:     Effort: Pulmonary effort is normal.  Musculoskeletal:        General: No swelling or tenderness.  Skin:    Findings: No bruising or erythema.  Psychiatric:        Mood and Affect: Mood normal.        Behavior: Behavior normal.        Thought Content: Thought content normal.   Villalta Score for Post-Thrombotic Syndrome: Pain: Absent Cramps: Absent Heaviness: Absent Paresthesia: Absent Pruritus: Mild Pretibial Edema: Absent Skin Induration: Absent Hyperpigmentation: Absent Redness: Absent Venous Ectasia: Absent Pain on calf compression: Absent Villalta Preliminary Score: 1 Is venous ulcer present?: No If venous ulcer is present and score is <15, then 15 points total are assigned: Absent Villalta Total Score: 1  LABS:  CBC     Component Value Date/Time   WBC 7.8 09/20/2023 1240   WBC 13.2 (H) 03/17/2020 0341   RBC 4.59 09/20/2023 1240   RBC 3.91 03/17/2020 0341   HGB 13.6 09/20/2023 1240   HCT 42.2 09/20/2023 1240   PLT 250 09/20/2023 1240   MCV 92 09/20/2023 1240   MCH 29.6 09/20/2023 1240   MCH 29.7 03/17/2020 0341   MCHC 32.2 09/20/2023 1240   MCHC 31.2 03/17/2020 0341   RDW 13.5 09/20/2023 1240    Hepatic  Function      Component Value Date/Time   PROT 6.7 09/20/2023 1240   ALBUMIN 3.9 09/20/2023 1240   AST 19 09/20/2023 1240   ALT 8 09/20/2023 1240   ALKPHOS 102 09/20/2023 1240   BILITOT 0.3 09/20/2023 1240    Renal Function   Lab Results  Component Value Date   CREATININE 0.79 09/20/2023   CREATININE 0.62 03/17/2020   CREATININE 0.69 03/09/2020    CrCl cannot be calculated (Patient's most recent lab result is older than the maximum 21 days allowed.).   VVS Vascular Lab Studies:  11/29/23 VAS [place] LOWER EXTREMITY VENOUS (DVT) Summary:  LEFT:  - Findings consistent with acute superficial vein thrombosis involving the  left great saphenous vein from the mid calf up to the saphenofemoral  junction. Thrombus appears mildly mobile at the [facility].  - There is no evidence of deep vein thrombosis in the lower extremity.   ASSESSMENT: Location of DVT: Left superficial vein  Since the thrombus was within 3 cm of the saphenofemoral junction, vascular surgeon Dr. Karin Lieu advised starting anticoagulation and treating for a total of 3 months for superficial thrombophlebitis. The patient is taking primidone, which is a strong CYP3A4 inducer. Unable to use Xarelto or Eliquis due to primidone significantly decreasing their concentrations. We initiated warfarin with a Lovenox bridge on 12/03/23, and she was able to discontinue Lovenox on 12/11/23 with INR >2. Primidone also decreases concentrations of warfarin but we can adjust the dose to ensure she maintains a therapeutic INR with goal of 2-3.   Today, INR remains supratherapeutic at 3.5 after dose decrease and incorporation of 1 serving of greens last week. Will hold a full dose today. She would like to eat more greens (vitamin K) in her diet so we will plan for that and continue to adjust her dose as needed. Once her INR is stable in range we can start to extend her follow ups.    PLAN: -Hold warfarin today. Then take warfarin 10 mg (2 tablets) daily.   -Plan to incorporate 2 servings of greens per week  moving forward.  -Expected duration of therapy: 3 months. Therapy started on 11/29/23. -Patient educated on purpose, proper use and potential adverse effects of warfarin (Coumadin). -Counseled on dietary and drug interactions of warfarin. -Discussed importance of taking medication around the same time every day. -Advised patient of medications to avoid (NSAIDs, aspirin doses >100 mg daily). -Educated that Tylenol (acetaminophen) is the preferred analgesic to lower the risk of bleeding. -Advised patient to alert all providers of anticoagulation therapy prior to starting a new medication or having a procedure. -Emphasized importance of monitoring for signs and symptoms of bleeding (abnormal bruising, prolonged bleeding, nose bleeds, bleeding from gums, discolored urine, black tarry stools). -Educated patient to present to the ED if emergent signs and symptoms of new thrombosis occur. -Counseled patient to wear compression stockings daily, removing at night. Elevate legs to help with swelling.   Follow up: 10 days for next INR check  Pervis Hocking, PharmD, BCACP, CPP Deep Vein Thrombosis Clinic Clinical Pharmacist Practitioner Office: [phone]

## 2023-12-27 ENCOUNTER — Ambulatory Visit (HOSPITAL_COMMUNITY): Payer: Medicare PPO

## 2024-01-03 ENCOUNTER — Ambulatory Visit (HOSPITAL_COMMUNITY)
Admission: RE | Admit: 2024-01-03 | Discharge: 2024-01-03 | Disposition: A | Discharge status: Home / Self Care | Payer: Medicare PPO | Source: Ambulatory Visit | Attending: Vascular Surgery | Admitting: Vascular Surgery

## 2024-01-03 DIAGNOSIS — Z7901 Long term (current) use of anticoagulants: Secondary | ICD-10-CM | POA: Diagnosis not present

## 2024-01-03 DIAGNOSIS — I8002 Phlebitis and thrombophlebitis of superficial vessels of left lower extremity: Secondary | ICD-10-CM | POA: Diagnosis not present

## 2024-01-03 LAB — POCT INR: INR: 2.3 (ref 2.0–3.0)

## 2024-01-03 NOTE — Progress Notes (Addendum)
DVT Clinic Note  Name: Katie Daniel     MRN: [id number]     DOB: December 14, 1945     Sex: female  PCP: Laurann Montana, MD  Today's Visit: Visit Information: Follow Up Visit  Referred to DVT Clinic by:  Cardiology  [facility]) - Dr. Jens Som Referred to CPP by: Dr. Chestine Spore Reason for referral:  Chief Complaint  Patient presents with   Warfarin management   HISTORY OF PRESENT ILLNESS: Katie Daniel is a 78 y.o. female with PMH CAD, aortic atherosclerosis, Parkinson's disease, who presents after diagnosis of DVT for medication management. Patient has a remote questionable history of DVT. In 07/2014 she was found to have an acute DVT involving the left calf veins and started on Xarelto. She was seen by a vein specialist shortly after that and MRI showed no DVT so she was taken off Xarelto. She also has a history of acute superficial vein thrombosis in the left mid calf 09/2020 which was treated with symptom management. She recently developed left medial thigh redness and tenderness and could feel a lump in her medial thigh. No recent injuries, illness, travel, or immobility. She has a history of varicose veins. She admits to being more dehydrated lately. Doppler 11/29/23 showed acute superficial vein thrombosis in the left great saphenous vein from the mid calf up to the saphenofemoral junction, and Dr. Jens Som referred the patient to the DVT Clinic. Due to its proximity to the [facility], Dr. Karin Lieu recommending treating with anticoagulation for 3 months. Due to DOAC drug interaction with primidone, warfarin was started on 11/29/23 and bridged with Lovenox. We have been continuing to monitor her INR closely to determine her appropriate dose.    Today, patient reports that her leg symptoms have completely resolved. Denies abnormal bleeding or bruising. Denies missed doses of warfarin. She had two servings of greens since 12/24/23 - salad on Monday, and broccoli last week. No new medications. She did take Tylenol  this morning for pain.   Positive Thrombotic Risk Factors: Previous VTE, Older Age Bleeding Risk Factors: Age >65 years, Anticoagulant therapy  Negative Thrombotic Risk Factors: Recent surgery (within 3 months), Recent trauma (within 3 months), Recent admission to hospital with acute illness (within 3 months), Paralysis, paresis, or recent plaster cast immobilization of lower extremity, Central venous catheterization, Bed rest >72 hours within 3 months, Sedentary journey lasting >8 hours within 4 weeks, Pregnancy, Within 6 weeks postpartum, Recent cesarean section (within 3 months), Estrogen therapy, Active cancer, Testosterone therapy, Recent COVID diagnosis (within 3 months), Erythropoiesis-stimulating agent, Non-malignant, chronic inflammatory condition, Known thrombophilic condition, Smoking, Obesity  Rx Insurance Coverage: Medicare Rx Affordability: Warfarin is $4 Preferred Pharmacy: [facility] in [place]  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    34.4 on CT 04/2021   Aortic atherosclerosis (HCC)    noted on chest CT 04/2021   Arthritis    DVT (deep venous thrombosis) (HCC)    Essential tremor    Fam hx-ischem heart disease    Fatigue    Hematoma    OF LEFT CALF   Left arm pain    Osteoarthritis    Osteopenia    Parkinson disease (HCC)    Squamous cell skin cancer    Scalp and side of nose   Vitamin D deficiency     Past Surgical History:  Procedure Laterality Date   COLONOSCOPY     KNEE ARTHROSCOPY WITH MENISCAL REPAIR Right  MOUTH SURGERY     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   Procedure: TOTAL KNEE ARTHROPLASTY;  Surgeon: Durene Romans, MD;  Location: [facility];  Service: Orthopedics;  Laterality: Right;  70 mins    Social History   Socioeconomic History   Marital status: Widowed    Spouse name: Not on file   Number of children: 2   Years of education: 12   Highest education level: Not on file   Occupational History   Occupation: retired  Tobacco Use   Smoking status: Never   Smokeless tobacco: Never  Vaping Use   Vaping status: Never Used  Substance and Sexual Activity   Alcohol use: Yes    Comment: glass of wine occ - 1 time per week   Drug use: Never   Sexual activity: Not on file  Other Topics Concern   Not on file  Social History Narrative   Drinks caffeine   Right handed   One story home   Social Drivers of Health   Financial Resource Strain: Not on file  Food Insecurity: Not on file  Transportation Needs: Not on file  Physical Activity: Not on file  Stress: Not on file  Social Connections: Not on file  Intimate Partner Violence: Not on file    Family History  Problem Relation Age of Onset   Heart attack Mother        Died of MI at 35   Stroke Father    Heart attack Father        MI in his 69's   Breast cancer Child    CAD Paternal Uncle        MI in 68's    Allergies as of 01/03/2024 - Review Complete 12/24/2023  Allergen Reaction Noted   Amoxicillin-pot clavulanate Other (See Comments) 01/25/2021   Latex Other (See Comments) 01/25/2021   Penicillins Hives 03/02/2020    Current Outpatient Medications on File Prior to Encounter  Medication Sig Dispense Refill   atorvastatin (LIPITOR) 20 MG tablet Take 1 tablet (20 mg total) by mouth daily. 90 tablet 3   carbidopa-levodopa (SINEMET IR) 25-100 MG tablet TAKE 1.5 TABLETS BY MOUTH AT 7AM; 1 TABLET AT 11AM; 1.5 TABLET AT 3PM; AND 1 TABLET AT 7 PM. 450 tablet 0   cholecalciferol (VITAMIN D3) 25 MCG (1000 UNIT) tablet Take 1,000 Units by mouth daily.     glucosamine-chondroitin 500-400 MG tablet Take 1 tablet by mouth 3 (three) times daily.     Opicapone (ONGENTYS) 50 MG CAPS Take 1 capsule by mouth daily. 90 capsule 1   primidone (MYSOLINE) 50 MG tablet TAKE 1 TABLET(50 MG) BY MOUTH TWICE DAILY 180 tablet 0   rOPINIRole (REQUIP XL) 8 MG 24 hr tablet TAKE 1 TABLET(8 MG) BY MOUTH DAILY 90 tablet 1    warfarin (COUMADIN) 5 MG tablet Take 2 tablets (10 mg total) by mouth daily. Or as directed by DVT Clinic. 60 tablet 1   No current facility-administered medications on file prior to encounter.   REVIEW OF SYSTEMS:  Review of Systems  HENT:  Negative for nosebleeds.   Respiratory:  Negative for hemoptysis.   Cardiovascular:  Negative for chest pain, palpitations and leg swelling.  Gastrointestinal:  Negative for blood in stool.  Genitourinary:  Negative for hematuria.   PHYSICAL EXAMINATION:  Physical Exam Constitutional:      Appearance: Normal appearance. She is normal weight.  Pulmonary:     Effort: Pulmonary effort is normal.  Musculoskeletal:  General: No tenderness.     Left lower leg: No edema.  Neurological:     Mental Status: She is alert.  Psychiatric:        Mood and Affect: Mood normal.        Behavior: Behavior normal.        Thought Content: Thought content normal.   Villalta Score for Post-Thrombotic Syndrome: Pain: Absent Cramps: Absent Heaviness: Absent Paresthesia: Absent Pruritus: Absent Pretibial Edema: Absent Skin Induration: Absent Hyperpigmentation: Absent Redness: Absent Venous Ectasia: Absent Pain on calf compression: Absent Villalta Preliminary Score: 0 Is venous ulcer present?: No If venous ulcer is present and score is <15, then 15 points total are assigned: Absent Villalta Total Score: 0  LABS:  CBC     Component Value Date/Time   WBC 7.8 09/20/2023 1240   WBC 13.2 (H) 03/17/2020 0341   RBC 4.59 09/20/2023 1240   RBC 3.91 03/17/2020 0341   HGB 13.6 09/20/2023 1240   HCT 42.2 09/20/2023 1240   PLT 250 09/20/2023 1240   MCV 92 09/20/2023 1240   MCH 29.6 09/20/2023 1240   MCH 29.7 03/17/2020 0341   MCHC 32.2 09/20/2023 1240   MCHC 31.2 03/17/2020 0341   RDW 13.5 09/20/2023 1240    Hepatic Function      Component Value Date/Time   PROT 6.7 09/20/2023 1240   ALBUMIN 3.9 09/20/2023 1240   AST 19 09/20/2023 1240   ALT 8  09/20/2023 1240   ALKPHOS 102 09/20/2023 1240   BILITOT 0.3 09/20/2023 1240    Renal Function   Lab Results  Component Value Date   CREATININE 0.79 09/20/2023   CREATININE 0.62 03/17/2020   CREATININE 0.69 03/09/2020    CrCl cannot be calculated (Patient's most recent lab result is older than the maximum 21 days allowed.).   VVS Vascular Lab Studies:  11/29/23 VAS [place] LOWER EXTREMITY VENOUS (DVT) Summary:  LEFT:  - Findings consistent with acute superficial vein thrombosis involving the  left great saphenous vein from the mid calf up to the saphenofemoral  junction. Thrombus appears mildly mobile at the [facility].  - There is no evidence of deep vein thrombosis in the lower extremity.   ASSESSMENT: Location of DVT: Left superficial vein Cause of DVT: provoked by a transient risk factor  Since the thrombus was within 3 cm of the saphenofemoral junction, vascular surgeon Dr. Karin Lieu advised starting anticoagulation and treating for a total of 3 months (ending ~02/27/24) for superficial thrombophlebitis. The patient is taking primidone, which is a strong CYP3A4 inducer. Unable to use Xarelto or Eliquis due to primidone significantly decreasing their concentrations. We initiated warfarin with a Lovenox bridge on 12/03/23, and she was able to discontinue Lovenox on 12/11/23 with INR >2. Primidone also decreases concentrations of warfarin but we can adjust the dose to ensure she maintains a therapeutic INR with goal of 2-3.    Today, INR is therapeutic at 2.3 after holding one dose on 1/20 and incorporating 2 servings of greens since her last visit. No s/sx of bleeding, bruising, or worsening thrombophlebitis. Will continue 10 mg (2 tablets) daily. For planning her servings of greens, we discussed that 1/2 of an avocado has about 1/3 as much vitamin K as a serving of greens or salad. Because patient is therapeutic after taking warfarin 10 mg daily consistently for over one week, will extend follow-up to 3  weeks.   PLAN: -Continue warfarin (Coumadin) 10 mg daily. -Expected duration of therapy: 3 months. Therapy started on  11/29/23. -Patient educated on purpose, proper use and potential adverse effects of warfarin (Coumadin). -Counseled on dietary and drug interactions of warfarin. -Discussed importance of taking medication around the same time every day. -Advised patient of medications to avoid (NSAIDs, aspirin doses >100 mg daily). -Educated that Tylenol (acetaminophen) is the preferred analgesic to lower the risk of bleeding. -Advised patient to alert all providers of anticoagulation therapy prior to starting a new medication or having a procedure. -Emphasized importance of monitoring for signs and symptoms of bleeding (abnormal bruising, prolonged bleeding, nose bleeds, bleeding from gums, discolored urine, black tarry stools). -Educated patient to present to the ED if emergent signs and symptoms of new thrombosis occur. -Counseled patient to wear compression stockings daily, removing at night.  Follow up: 01/24/24  Nils Pyle, PharmD PGY1 Pharmacy Resident  Pervis Hocking, PharmD, BCACP, CPP Deep Ve[facility] Thrombosis Clinic Clinical Pharmacist Practitioner Office: [phone]

## 2024-01-03 NOTE — Patient Instructions (Signed)
-  Continue 10 mg of warfarin (2 tablets) daily. You can eat 1-2 servings of greens per week.  -Your refills have been sent to [facility] in [place]. You may need to call the pharmacy to ask them to fill this when you start to run low on your current supply.  -It is important to take your medication around the same time every day.  -Avoid NSAIDs like ibuprofen (Advil, Motrin) and naproxen (Aleve) as well as aspirin doses over 100 mg daily. -Tylenol (acetaminophen) is the preferred over the counter pain medication to lower the risk of bleeding. -Be sure to alert all of your health care providers that you are taking an anticoagulant prior to starting a new medication or having a procedure. -Monitor for signs and symptoms of bleeding (abnormal bruising, prolonged bleeding, nose bleeds, bleeding from gums, discolored urine, black tarry stools). If you have fallen and hit your head OR if your bleeding is severe or not stopping, seek emergency care.  -Go to the emergency room if emergent signs and symptoms of new clot occur (new or worse swelling and pain in an arm or leg, shortness of breath, chest pain, fast or irregular heartbeats, lightheadedness, dizziness, fainting, coughing up blood) or if you experience a significant color change (pale or blue) in the extremity that has the DVT.  -We recommend you wear compression stockings (20-30 mmHg) as long as you are having swelling or pain. Be sure to purchase the correct size and take them off at night.   Your next visit is on 01/24/24 at 9:30AM.  [facility] [address] [place], [place], [place] [address] Enter the hospital through Entrance C off [facility] and pull up to the Heart & Vascular Center entrance to the free valet parking.  Check in for your appointment at the Heart & Vascular Center.   If you have any questions or need to reschedule an appointment, please call [phone] [facility].  If you are having an emergency,  call 911 or present to the nearest emergency room.   What is a DVT?  -Deep vein thrombosis (DVT) is a condition in which a blood clot forms in a vein of the deep venous system which can occur in the lower leg, thigh, pelvis, arm, or neck. This condition is serious and can be life-threatening if the clot travels to the arteries of the lungs and causing a blockage (pulmonary embolism, PE). A DVT can also damage veins in the leg, which can lead to long-term venous disease, leg pain, swelling, discoloration, and ulcers or sores (post-thrombotic syndrome).  -Treatment may include taking an anticoagulant medication to prevent more clots from forming and the current clot from growing, wearing compression stockings, and/or surgical procedures to remove or dissolve the clot.

## 2024-01-07 DIAGNOSIS — J09X2 Influenza due to identified novel influenza A virus with other respiratory manifestations: Secondary | ICD-10-CM | POA: Diagnosis not present

## 2024-01-07 DIAGNOSIS — R051 Acute cough: Secondary | ICD-10-CM | POA: Diagnosis not present

## 2024-01-07 DIAGNOSIS — R0981 Nasal congestion: Secondary | ICD-10-CM | POA: Diagnosis not present

## 2024-01-22 ENCOUNTER — Ambulatory Visit (HOSPITAL_COMMUNITY)
Admission: RE | Admit: 2024-01-22 | Discharge: 2024-01-22 | Disposition: A | Discharge status: Home / Self Care | Payer: Medicare PPO | Source: Ambulatory Visit | Attending: Vascular Surgery | Admitting: Vascular Surgery

## 2024-01-22 DIAGNOSIS — Z7901 Long term (current) use of anticoagulants: Secondary | ICD-10-CM

## 2024-01-22 DIAGNOSIS — I8002 Phlebitis and thrombophlebitis of superficial vessels of left lower extremity: Secondary | ICD-10-CM | POA: Diagnosis not present

## 2024-01-22 LAB — POCT INR: INR: 2.8 (ref 2.0–3.0)

## 2024-01-22 NOTE — Patient Instructions (Signed)
-  Continue warfarin 10 mg (2 tablets) daily. You can eat 1-2 servings of greens per week.  -It is important to take your medication around the same time every day.  -Avoid NSAIDs like ibuprofen (Advil, Motrin) and naproxen (Aleve) as well as aspirin doses over 100 mg daily. -Tylenol (acetaminophen) is the preferred over the counter pain medication to lower the risk of bleeding. -Be sure to alert all of your health care providers that you are taking an anticoagulant prior to starting a new medication or having a procedure. -Monitor for signs and symptoms of bleeding (abnormal bruising, prolonged bleeding, nose bleeds, bleeding from gums, discolored urine, black tarry stools). If you have fallen and hit your head OR if your bleeding is severe or not stopping, seek emergency care.  -Go to the emergency room if emergent signs and symptoms of new clot occur (new or worse swelling and pain in an arm or leg, shortness of breath, chest pain, fast or irregular heartbeats, lightheadedness, dizziness, fainting, coughing up blood) or if you experience a significant color change (pale or blue) in the extremity that has the DVT.   Your next visit is on Monday March 24th at [facility] [address] [place], [place], [place] [address] Enter the hospital through Entrance C off [facility] and pull up to the Heart & Vascular Center entrance to the free valet parking.  Check in for your appointment at the Heart & Vascular Center.   If you have any questions or need to reschedule an appointment, please call [phone] [facility].  If you are having an emergency, call 911 or present to the nearest emergency room.   What is a DVT?  -Deep vein thrombosis (DVT) is a condition in which a blood clot forms in a vein of the deep venous system which can occur in the lower leg, thigh, pelvis, arm, or neck. This condition is serious and can be life-threatening if the clot travels to the arteries of  the lungs and causing a blockage (pulmonary embolism, PE). A DVT can also damage veins in the leg, which can lead to long-term venous disease, leg pain, swelling, discoloration, and ulcers or sores (post-thrombotic syndrome).  -Treatment may include taking an anticoagulant medication to prevent more clots from forming and the current clot from growing, wearing compression stockings, and/or surgical procedures to remove or dissolve the clot.

## 2024-01-22 NOTE — Progress Notes (Signed)
DVT Clinic Note  Name: Katie Daniel     MRN: [id number]     DOB: 1946/04/13     Sex: female  PCP: Laurann Montana, MD  Today's Visit: Visit Information: Follow Up Visit  Referred to DVT Clinic by:  Cardiology  [facility]) - Dr. Jens Som Referred to CPP by: Dr. Chestine Spore Reason for referral:  Chief Complaint  Patient presents with   Warfarin management   HISTORY OF PRESENT ILLNESS: Katie Daniel is a 78 y.o. female with PMH CAD, aortic atherosclerosis, Parkinson's disease, who presents after diagnosis of DVT for medication management. Patient has a remote questionable history of DVT. In 07/2014 she was found to have an acute DVT involving the left calf veins and started on Xarelto. She was seen by a vein specialist shortly after that and MRI showed no DVT so she was taken off Xarelto. She also has a history of acute superficial vein thrombosis in the left mid calf 09/2020 which was treated with symptom management. She recently developed left medial thigh redness and tenderness and could feel a lump in her medial thigh. No recent injuries, illness, travel, or immobility. She has a history of varicose veins. She admits to being more dehydrated lately. Doppler 11/29/23 showed acute superficial vein thrombosis in the left great saphenous vein from the mid calf up to the saphenofemoral junction, and Dr. Jens Som referred the patient to the DVT Clinic. Due to its proximity to the [facility], Dr. Karin Lieu recommending treating with anticoagulation for 3 months. Due to DOAC drug interaction with primidone, warfarin was started on 11/29/23 and bridged with Lovenox. We have been continuing to monitor her INR closely for the duration of her treatment.   Today, patient reports that she has continued to have resolution of LLE symptoms. Denies abnormal bleeding or bruising. Denies missed doses of warfarin. She has continued to have 2 servings of greens per week. No new medications.   Positive Thrombotic Risk Factors:  Older Age Bleeding Risk Factors: Age >65 years, Anticoagulant therapy  Negative Thrombotic Risk Factors: Previous VTE, Recent surgery (within 3 months), Recent trauma (within 3 months), Recent admission to hospital with acute illness (within 3 months), Paralysis, paresis, or recent plaster cast immobilization of lower extremity, Central venous catheterization, Bed rest >72 hours within 3 months, Sedentary journey lasting >8 hours within 4 weeks, Pregnancy, Within 6 weeks postpartum, Recent cesarean section (within 3 months), Estrogen therapy, Testosterone therapy, Erythropoiesis-stimulating agent, Recent COVID diagnosis (within 3 months), Active cancer, Non-malignant, chronic inflammatory condition, Known thrombophilic condition, Smoking, Obesity  Rx Insurance Coverage: Medicare Rx Affordability: Warfarin is $4 Preferred Pharmacy: [facility] in [place]  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    34.4 on CT 04/2021   Aortic atherosclerosis (HCC)    noted on chest CT 04/2021   Arthritis    DVT (deep venous thrombosis) (HCC)    Essential tremor    Fam hx-ischem heart disease    Fatigue    Hematoma    OF LEFT CALF   Left arm pain    Osteoarthritis    Osteopenia    Parkinson disease (HCC)    Squamous cell skin cancer    Scalp and side of nose   Vitamin D deficiency     Past Surgical History:  Procedure Laterality Date   COLONOSCOPY     KNEE ARTHROSCOPY WITH MENISCAL REPAIR Right    MOUTH SURGERY     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   Procedure: TOTAL KNEE ARTHROPLASTY;  Surgeon: Durene Romans, MD;  Location: [facility];  Service: Orthopedics;  Laterality: Right;  70 mins    Social History   Socioeconomic History   Marital status: Widowed    Spouse name: Not on file   Number of children: 2   Years of education: 14   Highest education level: Not on file  Occupational History   Occupation: retired  Tobacco Use   Smoking  status: Never   Smokeless tobacco: Never  Vaping Use   Vaping status: Never Used  Substance and Sexual Activity   Alcohol use: Yes    Comment: glass of wine occ - 1 time per week   Drug use: Never   Sexual activity: Not on file  Other Topics Concern   Not on file  Social History Narrative   Drinks caffeine   Right handed   One story home   Social Drivers of Health   Financial Resource Strain: Not on file  Food Insecurity: Not on file  Transportation Needs: Not on file  Physical Activity: Not on file  Stress: Not on file  Social Connections: Not on file  Intimate Partner Violence: Not on file    Family History  Problem Relation Age of Onset   Heart attack Mother        Died of MI at 76   Stroke Father    Heart attack Father        MI in his 44's   Breast cancer Child    CAD Paternal Uncle        MI in 14's    Allergies as of 01/22/2024 - Review Complete 01/03/2024  Allergen Reaction Noted   Amoxicillin-pot clavulanate Other (See Comments) 01/25/2021   Latex Other (See Comments) 01/25/2021   Penicillins Hives 03/02/2020    Current Outpatient Medications on File Prior to Encounter  Medication Sig Dispense Refill   atorvastatin (LIPITOR) 20 MG tablet Take 1 tablet (20 mg total) by mouth daily. 90 tablet 3   carbidopa-levodopa (SINEMET IR) 25-100 MG tablet TAKE 1.5 TABLETS BY MOUTH AT 7AM; 1 TABLET AT 11AM; 1.5 TABLET AT 3PM; AND 1 TABLET AT 7 PM. 450 tablet 0   cholecalciferol (VITAMIN D3) 25 MCG (1000 UNIT) tablet Take 1,000 Units by mouth daily.     glucosamine-chondroitin 500-400 MG tablet Take 1 tablet by mouth 3 (three) times daily.     Opicapone (ONGENTYS) 50 MG CAPS Take 1 capsule by mouth daily. 90 capsule 1   primidone (MYSOLINE) 50 MG tablet TAKE 1 TABLET(50 MG) BY MOUTH TWICE DAILY 180 tablet 0   rOPINIRole (REQUIP XL) 8 MG 24 hr tablet TAKE 1 TABLET(8 MG) BY MOUTH DAILY 90 tablet 1   warfarin (COUMADIN) 5 MG tablet Take 2 tablets (10 mg total) by mouth  daily. Or as directed by DVT Clinic. 60 tablet 1   No current facility-administered medications on file prior to encounter.   REVIEW OF SYSTEMS:  Review of Systems  HENT:  Negative for nosebleeds.   Respiratory:  Negative for hemoptysis.   Cardiovascular:  Negative for chest pain, palpitations and leg swelling.  Gastrointestinal:  Negative for blood in stool.  Genitourinary:  Negative for hematuria.   PHYSICAL EXAMINATION:  Physical Exam Constitutional:      Appearance: Normal appearance. She is normal weight.  Pulmonary:     Effort: Pulmonary effort is normal.  Musculoskeletal:        General: No tenderness.  Left lower leg: No edema.  Neurological:     Mental Status: She is alert.  Psychiatric:        Mood and Affect: Mood normal.        Behavior: Behavior normal.        Thought Content: Thought content normal.   Villalta Score for Post-Thrombotic Syndrome: Pain: Absent Cramps: Absent Heaviness: Absent Paresthesia: Absent Pruritus: Absent Pretibial Edema: Absent Skin Induration: Absent Hyperpigmentation: Absent Redness: Absent Venous Ectasia: Absent Pain on calf compression: Absent Villalta Preliminary Score: 0 Is venous ulcer present?: No If venous ulcer is present and score is <15, then 15 points total are assigned: Absent Villalta Total Score: 0  LABS:  CBC     Component Value Date/Time   WBC 7.8 09/20/2023 1240   WBC 13.2 (H) 03/17/2020 0341   RBC 4.59 09/20/2023 1240   RBC 3.91 03/17/2020 0341   HGB 13.6 09/20/2023 1240   HCT 42.2 09/20/2023 1240   PLT 250 09/20/2023 1240   MCV 92 09/20/2023 1240   MCH 29.6 09/20/2023 1240   MCH 29.7 03/17/2020 0341   MCHC 32.2 09/20/2023 1240   MCHC 31.2 03/17/2020 0341   RDW 13.5 09/20/2023 1240    Hepatic Function      Component Value Date/Time   PROT 6.7 09/20/2023 1240   ALBUMIN 3.9 09/20/2023 1240   AST 19 09/20/2023 1240   ALT 8 09/20/2023 1240   ALKPHOS 102 09/20/2023 1240   BILITOT 0.3  09/20/2023 1240    Renal Function   Lab Results  Component Value Date   CREATININE 0.79 09/20/2023   CREATININE 0.62 03/17/2020   CREATININE 0.69 03/09/2020    CrCl cannot be calculated (Patient's most recent lab result is older than the maximum 21 days allowed.).   [facility] Vascular Lab Studies:  11/29/23 VAS [place] LOWER EXTREMITY VENOUS (DVT) Summary:  LEFT:  - Findings consistent with acute superficial vein thrombosis involving the  left great saphenous vein from the mid calf up to the saphenofemoral  junction. Thrombus appears mildly mobile at the [facility].  - There is no evidence of deep vein thrombosis in the lower extremity.   ASSESSMENT: Location of DVT: Left superficial vein Cause of DVT: provoked by a transient risk factor  Since the thrombus was within 3 cm of the saphenofemoral junction, vascular surgeon Dr. Karin Lieu advised starting anticoagulation and treating for a total of 3 months (ending ~02/27/24) for superficial thrombophlebitis. The patient is taking primidone, which is a strong CYP3A4 inducer. Unable to use Xarelto or Eliquis due to primidone significantly decreasing their concentrations. We initiated warfarin with a Lovenox bridge on 11/29/23, and she was able to discontinue Lovenox on 12/11/23 with INR >2. Primidone also decreases concentrations of warfarin but we can adjust the dose to ensure she maintains a therapeutic INR with goal of 2-3.   Today, INR is therapeutic at 2.8. No s/sx of bleeding, bruising, or worsening thrombophlebitis. Will continue warfarin 10 mg (2 tablets) daily. Continue eating 1-2 servings of greens per week.   PLAN: -Continue warfarin (Coumadin) 10 mg daily. -Expected duration of therapy: 3 months. Therapy started on 11/29/23. -Patient educated on purpose, proper use and potential adverse effects of warfarin (Coumadin). -Counseled on dietary and drug interactions of warfarin. -Discussed importance of taking medication around the same time every  day. -Advised patient of medications to avoid (NSAIDs, aspirin doses >100 mg daily). -Educated that Tylenol (acetaminophen) is the preferred analgesic to lower the risk of bleeding. -Advised patient to alert  all providers of anticoagulation therapy prior to starting a new medication or having a procedure. -Emphasized importance of monitoring for signs and symptoms of bleeding (abnormal bruising, prolonged bleeding, nose bleeds, bleeding from gums, discolored urine, black tarry stools). -Educated patient to present to the ED if emergent signs and symptoms of new thrombosis occur. -Counseled patient to wear compression stockings daily, removing at night.  Follow up: 5 weeks for next INR/end of treatment visit  Pervis Hocking, PharmD, [place], CPP Deep Vein Thrombosis Clinic Clinical Pharmacist Practitioner Office: [phone]

## 2024-01-24 ENCOUNTER — Ambulatory Visit (HOSPITAL_COMMUNITY): Payer: Medicare PPO

## 2024-02-01 DIAGNOSIS — H00014 Hordeolum externum left upper eyelid: Secondary | ICD-10-CM | POA: Diagnosis not present

## 2024-02-12 ENCOUNTER — Other Ambulatory Visit (HOSPITAL_COMMUNITY): Payer: Self-pay | Admitting: Student-PharmD

## 2024-02-12 DIAGNOSIS — I8002 Phlebitis and thrombophlebitis of superficial vessels of left lower extremity: Secondary | ICD-10-CM

## 2024-02-12 MED ORDER — WARFARIN SODIUM 5 MG PO TABS: 10.0000 mg | ORAL_TABLET | Freq: Every day | ORAL | 0 refills | Status: DC

## 2024-02-13 DIAGNOSIS — D2262 Melanocytic nevi of left upper limb, including shoulder: Secondary | ICD-10-CM | POA: Diagnosis not present

## 2024-02-13 DIAGNOSIS — Z789 Other specified health status: Secondary | ICD-10-CM | POA: Diagnosis not present

## 2024-02-13 DIAGNOSIS — L570 Actinic keratosis: Secondary | ICD-10-CM | POA: Diagnosis not present

## 2024-02-13 DIAGNOSIS — D225 Melanocytic nevi of trunk: Secondary | ICD-10-CM | POA: Diagnosis not present

## 2024-02-13 DIAGNOSIS — L820 Inflamed seborrheic keratosis: Secondary | ICD-10-CM | POA: Diagnosis not present

## 2024-02-13 DIAGNOSIS — X32XXXA Exposure to sunlight, initial encounter: Secondary | ICD-10-CM | POA: Diagnosis not present

## 2024-02-13 DIAGNOSIS — D485 Neoplasm of uncertain behavior of skin: Secondary | ICD-10-CM | POA: Diagnosis not present

## 2024-02-13 DIAGNOSIS — D2271 Melanocytic nevi of right lower limb, including hip: Secondary | ICD-10-CM | POA: Diagnosis not present

## 2024-02-13 DIAGNOSIS — L309 Dermatitis, unspecified: Secondary | ICD-10-CM | POA: Diagnosis not present

## 2024-02-22 NOTE — Progress Notes (Addendum)
 DVT Clinic Note  Name: Katie Daniel     MRN: [id number]     DOB: Mar 04, 1946     Sex: female  PCP: Laurann Montana, MD  Today's Visit: Visit Information: Discharge Visit  Referred to DVT Clinic by:  [facility])  - Dr. Jens Som Referred to CPP by: Dr. Karin Lieu Reason for referral:  Chief Complaint  Patient presents with   Warfarin management   HISTORY OF PRESENT ILLNESS: Katie Daniel is a 78 y.o. female with PMH CAD, aortic atherosclerosis, Parkinson's disease, who presents after diagnosis of DVT for medication management. Patient has a remote questionable history of DVT. In 07/2014 she was found to have an acute DVT involving the left calf veins and started on Xarelto. She was seen by a vein specialist shortly after that and MRI showed no DVT so she was taken off Xarelto. She also has a history of acute superficial vein thrombosis in the left mid calf 09/2020 which was treated with symptom management. She recently developed left medial thigh redness and tenderness and could feel a lump in her medial thigh. No recent injuries, illness, travel, or immobility. She has a history of varicose veins. She admits to being more dehydrated lately. Doppler 11/29/23 showed acute superficial vein thrombosis in the left great saphenous vein from the mid calf up to the saphenofemoral junction, and Dr. Jens Som referred the patient to the DVT Clinic. Due to its proximity to the [facility], Dr. Karin Lieu recommending treating with anticoagulation for 3 months. Due to DOAC drug interaction with primidone, warfarin was started on 11/29/23 and bridged with Lovenox. We have been continuing to monitor her INR closely for the duration of her treatment.   Today, patient reports that she has continued to have resolution of LLE symptoms. She took her last dose of warfarin on hand last night. Denies abnormal bleeding or bruising. Denies missed doses of warfarin. She has continued to have 2 servings of greens per week. No  new medications.   Positive Thrombotic Risk Factors: Older Age Bleeding Risk Factors: Age >65 years, Anticoagulant therapy  Negative Thrombotic Risk Factors: Previous VTE, Recent surgery (within 3 months), Recent trauma (within 3 months), Recent admission to hospital with acute illness (within 3 months), Paralysis, paresis, or recent plaster cast immobilization of lower extremity, Central venous catheterization, Bed rest >72 hours within 3 months, Sedentary journey lasting >8 hours within 4 weeks, Pregnancy, Within 6 weeks postpartum, Recent cesarean section (within 3 months), Estrogen therapy, Testosterone therapy, Erythropoiesis-stimulating agent, Recent COVID diagnosis (within 3 months), Active cancer, Non-malignant, chronic inflammatory condition, Known thrombophilic condition, Smoking, Obesity  Rx Insurance Coverage: Medicare Rx Affordability: Warfarin is $4 Rx Assistance Provided:  None needed at this time. Preferred Pharmacy: [facility] in [place]  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    34.4 on CT 04/2021   Aortic atherosclerosis (HCC)    noted on chest CT 04/2021   Arthritis    DVT (deep venous thrombosis) (HCC)    Essential tremor    Fam hx-ischem heart disease    Fatigue    Hematoma    OF LEFT CALF   Left arm pain    Osteoarthritis    Osteopenia    Parkinson disease (HCC)    Squamous cell skin cancer    Scalp and side of nose   Vitamin D deficiency     Past Surgical History:  Procedure Laterality Date   COLONOSCOPY  KNEE ARTHROSCOPY WITH MENISCAL REPAIR Right    MOUTH SURGERY     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   Procedure: TOTAL KNEE ARTHROPLASTY;  Surgeon: Durene Romans, MD;  Location: [facility];  Service: Orthopedics;  Laterality: Right;  70 mins    Social History   Socioeconomic History   Marital status: Widowed    Spouse name: Not on file   Number of children: 2   Years of education: 74    Highest education level: Not on file  Occupational History   Occupation: retired  Tobacco Use   Smoking status: Never   Smokeless tobacco: Never  Vaping Use   Vaping status: Never Used  Substance and Sexual Activity   Alcohol use: Yes    Comment: glass of wine occ - 1 time per week   Drug use: Never   Sexual activity: Not on file  Other Topics Concern   Not on file  Social History Narrative   Drinks caffeine   Right handed   One story home   Social Drivers of Health   Financial Resource Strain: Not on file  Food Insecurity: Not on file  Transportation Needs: Not on file  Physical Activity: Not on file  Stress: Not on file  Social Connections: Not on file  Intimate Partner Violence: Not on file    Family History  Problem Relation Age of Onset   Heart attack Mother        Died of MI at 72   Stroke Father    Heart attack Father        MI in his 64's   Breast cancer Child    CAD Paternal Uncle        MI in 48's    Allergies as of 02/25/2024 - Review Complete 02/25/2024  Allergen Reaction Noted   Amoxicillin-pot clavulanate Other (See Comments) 01/25/2021   Latex Other (See Comments) 01/25/2021   Penicillins Hives 03/02/2020    Current Outpatient Medications on File Prior to Encounter  Medication Sig Dispense Refill   atorvastatin (LIPITOR) 20 MG tablet Take 1 tablet (20 mg total) by mouth daily. 90 tablet 3   carbidopa-levodopa (SINEMET IR) 25-100 MG tablet TAKE 1.5 TABLETS BY MOUTH AT 7AM; 1 TABLET AT 11AM; 1.5 TABLET AT 3PM; AND 1 TABLET AT 7 PM. 450 tablet 0   cholecalciferol (VITAMIN D3) 25 MCG (1000 UNIT) tablet Take 1,000 Units by mouth daily.     glucosamine-chondroitin 500-400 MG tablet Take 1 tablet by mouth 3 (three) times daily.     Opicapone (ONGENTYS) 50 MG CAPS Take 1 capsule by mouth daily. 90 capsule 1   primidone (MYSOLINE) 50 MG tablet TAKE 1 TABLET(50 MG) BY MOUTH TWICE DAILY 180 tablet 0   rOPINIRole (REQUIP XL) 8 MG 24 hr tablet TAKE 1 TABLET(8  MG) BY MOUTH DAILY 90 tablet 1   No current facility-administered medications on file prior to encounter.   REVIEW OF SYSTEMS:  Review of Systems  Respiratory:  Negative for shortness of breath.   Cardiovascular:  Negative for chest pain, palpitations and leg swelling.  Musculoskeletal:  Negative for myalgias.  Neurological:  Negative for dizziness and tingling.   PHYSICAL EXAMINATION:  Physical Exam Pulmonary:     Effort: Pulmonary effort is normal.  Musculoskeletal:        General: No swelling or tenderness.  Skin:    Findings: No bruising or erythema.  Psychiatric:        Mood and Affect: Mood normal.  Behavior: Behavior normal.        Thought Content: Thought content normal.   Villalta Score for Post-Thrombotic Syndrome: Pain: Absent Cramps: Absent Heaviness: Absent Paresthesia: Absent Pruritus: Absent Pretibial Edema: Absent Skin Induration: Absent Hyperpigmentation: Absent Redness: Absent Venous Ectasia: Absent Pain on calf compression: Absent Villalta Preliminary Score: 0 Is venous ulcer present?: No If venous ulcer is present and score is <15, then 15 points total are assigned: Absent Villalta Total Score: 0  LABS:  CBC     Component Value Date/Time   WBC 7.8 09/20/2023 1240   WBC 13.2 (H) 03/17/2020 0341   RBC 4.59 09/20/2023 1240   RBC 3.91 03/17/2020 0341   HGB 13.6 09/20/2023 1240   HCT 42.2 09/20/2023 1240   PLT 250 09/20/2023 1240   MCV 92 09/20/2023 1240   MCH 29.6 09/20/2023 1240   MCH 29.7 03/17/2020 0341   MCHC 32.2 09/20/2023 1240   MCHC 31.2 03/17/2020 0341   RDW 13.5 09/20/2023 1240    Hepatic Function      Component Value Date/Time   PROT 6.7 09/20/2023 1240   ALBUMIN 3.9 09/20/2023 1240   AST 19 09/20/2023 1240   ALT 8 09/20/2023 1240   ALKPHOS 102 09/20/2023 1240   BILITOT 0.3 09/20/2023 1240    Renal Function   Lab Results  Component Value Date   CREATININE 0.79 09/20/2023   CREATININE 0.62 03/17/2020    CREATININE 0.69 03/09/2020    CrCl cannot be calculated (Patient's most recent lab result is older than the maximum 21 days allowed.).   [facility] Vascular Lab Studies:  11/29/23 VAS [place] LOWER EXTREMITY VENOUS (DVT) Summary:  LEFT:  - Findings consistent with acute superficial vein thrombosis involving the  left great saphenous vein from the mid calf up to the saphenofemoral  junction. Thrombus appears mildly mobile at the [facility].  - There is no evidence of deep vein thrombosis in the lower extremity.   ASSESSMENT: Location of DVT: Left superficial vein  Since the thrombus was within 3 cm of the saphenofemoral junction, vascular surgeon Dr. Karin Lieu advised starting anticoagulation and treating for a total of 3 months for superficial thrombophlebitis. The patient is taking primidone, which is a strong CYP3A4 inducer. Unable to use Xarelto or Eliquis due to primidone significantly decreasing their concentrations. We initiated warfarin with a Lovenox bridge on 11/29/23, and she was able to discontinue Lovenox on 12/11/23 with INR >2. Primidone also decreases concentrations of warfarin but we can adjust the dose to ensure she maintains a therapeutic INR with goal of 2-3.   Patient has now reached the end of her planned 3 months of anticoagulation. She has maintained good INR control with warfarin 10 mg daily. Her LLE pain and swelling related to the superficial vein thrombosis has resolved. Discussed previously with Dr. Randie Heinz, who recommends aspirin 81 mg after completion of anticoagulation in light of her history of multiple events of superficial thrombophlebitis. She confirms understanding. Her last dose of warfarin was last night. No questions or concerns at this time.  PLAN: -Patient is discharged from the DVT Clinic. -Discontinue anticoagulation with warfarin, as patient has completed 3 months of treatment per Dr. Karin Lieu. Start taking aspirin 81 mg daily.  -Counseled patient on future VTE risk reduction  strategies and to inform all future providers of superficial thrombophlebitis history.  Follow up: No further follow up needed in DVT Clinic at this time but available if needed.  Pervis Hocking, PharmD, [place], CPP Deep Vein Thrombosis Clinic Clinical Pharmacist  Practitioner  I have evaluated the patient's chart/imaging and refer this patient to the Clinical Pharmacist Practitioner for medication management. I have reviewed the CPP's documentation and agree with her assessment and plan. I was immediately available during the visit for questions and collaboration.   Victorino Sparrow, MD

## 2024-02-25 ENCOUNTER — Ambulatory Visit (HOSPITAL_COMMUNITY)
Admission: RE | Admit: 2024-02-25 | Discharge: 2024-02-25 | Disposition: A | Discharge status: Home / Self Care | Payer: Medicare PPO | Source: Ambulatory Visit | Attending: Vascular Surgery | Admitting: Vascular Surgery

## 2024-02-25 DIAGNOSIS — Z7901 Long term (current) use of anticoagulants: Secondary | ICD-10-CM | POA: Diagnosis not present

## 2024-02-25 DIAGNOSIS — I8002 Phlebitis and thrombophlebitis of superficial vessels of left lower extremity: Secondary | ICD-10-CM | POA: Insufficient documentation

## 2024-02-25 NOTE — Patient Instructions (Addendum)
 You have been discharged from the DVT Clinic! No further follow up in the DVT Clinic is needed.  -Stop taking warfarin. Start taking aspirin 81 mg (enteric coated).   Please reach out if any questions come up. [phone] [facility]. We are moving locations as of 03/31/24, and the contact number AFTER that time will be [phone].

## 2024-02-29 ENCOUNTER — Other Ambulatory Visit: Payer: Self-pay | Admitting: Neurology

## 2024-02-29 DIAGNOSIS — G250 Essential tremor: Secondary | ICD-10-CM

## 2024-02-29 DIAGNOSIS — G20A1 Parkinson's disease without dyskinesia, without mention of fluctuations: Secondary | ICD-10-CM

## 2024-03-03 ENCOUNTER — Other Ambulatory Visit: Payer: Self-pay | Admitting: Neurology

## 2024-03-03 DIAGNOSIS — G250 Essential tremor: Secondary | ICD-10-CM

## 2024-03-03 DIAGNOSIS — G20A1 Parkinson's disease without dyskinesia, without mention of fluctuations: Secondary | ICD-10-CM

## 2024-03-07 DIAGNOSIS — G250 Essential tremor: Secondary | ICD-10-CM | POA: Diagnosis not present

## 2024-03-07 DIAGNOSIS — G479 Sleep disorder, unspecified: Secondary | ICD-10-CM | POA: Diagnosis not present

## 2024-03-07 DIAGNOSIS — Z0000 Encounter for general adult medical examination without abnormal findings: Secondary | ICD-10-CM | POA: Diagnosis not present

## 2024-03-07 DIAGNOSIS — G20A1 Parkinson's disease without dyskinesia, without mention of fluctuations: Secondary | ICD-10-CM | POA: Diagnosis not present

## 2024-03-07 DIAGNOSIS — I7090 Unspecified atherosclerosis: Secondary | ICD-10-CM | POA: Diagnosis not present

## 2024-03-07 DIAGNOSIS — E559 Vitamin D deficiency, unspecified: Secondary | ICD-10-CM | POA: Diagnosis not present

## 2024-03-07 DIAGNOSIS — I700 Atherosclerosis of aorta: Secondary | ICD-10-CM | POA: Diagnosis not present

## 2024-03-07 DIAGNOSIS — Z1331 Encounter for screening for depression: Secondary | ICD-10-CM | POA: Diagnosis not present

## 2024-03-07 DIAGNOSIS — E785 Hyperlipidemia, unspecified: Secondary | ICD-10-CM | POA: Diagnosis not present

## 2024-03-07 DIAGNOSIS — M8588 Other specified disorders of bone density and structure, other site: Secondary | ICD-10-CM | POA: Diagnosis not present

## 2024-03-10 ENCOUNTER — Other Ambulatory Visit: Payer: Self-pay | Admitting: Neurology

## 2024-03-10 DIAGNOSIS — G20A1 Parkinson's disease without dyskinesia, without mention of fluctuations: Secondary | ICD-10-CM

## 2024-03-10 DIAGNOSIS — G250 Essential tremor: Secondary | ICD-10-CM

## 2024-03-10 DIAGNOSIS — M533 Sacrococcygeal disorders, not elsewhere classified: Secondary | ICD-10-CM | POA: Diagnosis not present

## 2024-03-10 DIAGNOSIS — M5416 Radiculopathy, lumbar region: Secondary | ICD-10-CM | POA: Diagnosis not present

## 2024-03-19 DIAGNOSIS — M533 Sacrococcygeal disorders, not elsewhere classified: Secondary | ICD-10-CM | POA: Diagnosis not present

## 2024-04-03 DIAGNOSIS — M5416 Radiculopathy, lumbar region: Secondary | ICD-10-CM | POA: Diagnosis not present

## 2024-04-11 DIAGNOSIS — C44319 Basal cell carcinoma of skin of other parts of face: Secondary | ICD-10-CM | POA: Diagnosis not present

## 2024-04-11 HISTORY — PX: MOHS SURGERY: SUR867

## 2024-04-18 NOTE — Progress Notes (Signed)
 Assessment/Plan:   1. ET/PD             -Patient initially diagnosed with Parkinson's disease in 2014 by Dr. Bary Likes.  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 Geralyn Knee in 2018. 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  Discussed good prognosis with this             - Increase carbidopa /levodopa  25/100, 1.5 tablets at 7 AM/2 tablet at 11 AM/2 tablet at 3 PM/1 tablet at 7 PM.  This is because she is complaining about rigidity in the late afternoon.  -continue Opicapone , 50 mg at bedtime.  Discussed purpose of opicapone .                -Continue Requip  XL, 8 mg daily.             -Continue primidone , 50 mg twice daily.   Not sure that this is helping, but she has a very significant component of essential tremor on the right particularly and would probably need DBS to fix this part.    2.  History of syncope  -her syncope/near syncope occurred while seated twice now (July, 2022 in December 2022) and this is more evidence that its not related to Neurogenic Orthostatic Hypotension.  She has not been orthostatic when tested in the hospital.  -Zio patch was negative.  Follows with both Dr. Micael Adas and Dr. Carolynne Citron.  Dr. Carolynne Citron told her he felt her episodes were from orthostasis, although syncope has occurred while seated.  She is considering a loop recorder   3.  Scalp melanoma  -following with [facility]  -has had several Mohs sx now for [facility] and [facility]  -She understands that Parkinson's does slightly increase our risk for melanoma.  She will continue to follow with dermatology.  4.  Nocturia  -will let me know if wants referral to urology/urogyn  5.  Sleep apnea, mild  - AHI on nocturnal polysomnogram December, 2024 was 9.8  - Following with cardiology  - Not able to tolerate CPAP.  Subjective:   Katie Daniel was seen today in follow up for ET/Parkinsons disease.  My previous records were reviewed prior to todays visit as well as  outside records available to me.  She continues to take her levodopa , ropinirole , primidone , opicapone  as prescribed.  She saw her dermatologist May 9 and had another Mohs procedure.  Notes are reviewed.  She had a sleep study done with Dr. Micael Adas.  Her AHI was 9.8.  CPAP was recommended.  She tried it and she had difficulty tolerating it..  She has had no falls.  She has not been exercising quite as vigorously as she would like to because of her sciatica.  Current prescribed movement disorder medications: carbidopa /levodopa  25/100, 1.5 tablets at 7 AM/1 tablet at 11 AM/1.5 tablet at 3 PM/1 tablet at 7 PM  Requip  XL, 8 mg daily  Primidone  50 mg,  1 po bid Opicapone , 50 mg at bedtime    PREVIOUS MEDICATIONS:  amantadine (no help for tremor so d/c); carbidopa /levodopa  25/100; requip  xl; primidone ; entacapone  - given but she didn't take it; opicapone ; carbidopa /levodopa  50/200 (she took 3 dosages and d/c - no help for stutter steps at bed)   ALLERGIES:   Allergies  Allergen Reactions   Amoxicillin-Pot Clavulanate Other (See Comments)   Latex Other (See Comments)   Penicillins Hives    Did it involve swelling of the face/tongue/throat, SOB,  or low BP? No Did it involve sudden or severe rash/hives, skin peeling, or any reaction on the inside of your mouth or nose? Yes Did you need to seek medical attention at a hospital or doctor's office? Yes When did it last happen?      5 years If all above answers are "NO", may proceed with cephalosporin use. Tolerated ancef  03/16/20    CURRENT MEDICATIONS:  Outpatient Encounter Medications as of 04/21/2024  Medication Sig   atorvastatin  (LIPITOR) 20 MG tablet Take 1 tablet (20 mg total) by mouth daily.   carbidopa -levodopa  (SINEMET  IR) 25-100 MG tablet TAKE 1 AND 1/2 TABLETS BY MOUTH AT 7AM; 1 TABLET AT 11AM; 1 AND 1/2TABLET AT 3PM; AND 1 TABLET AT 7 PM.   cholecalciferol (VITAMIN D3) 25 MCG (1000 UNIT) tablet Take 1,000 Units by mouth daily.    glucosamine-chondroitin 500-400 MG tablet Take 1 tablet by mouth 3 (three) times daily.   Opicapone  (ONGENTYS ) 50 MG CAPS Take 1 capsule by mouth daily.   primidone  (MYSOLINE ) 50 MG tablet TAKE 1 TABLET(50 MG) BY MOUTH TWICE DAILY   rOPINIRole  (REQUIP  XL) 8 MG 24 hr tablet TAKE 1 TABLET(8 MG) BY MOUTH DAILY   No facility-administered encounter medications on file as of 04/21/2024.    Objective:   PHYSICAL EXAMINATION:    VITALS:   Vitals:   04/21/24 1114  BP: (!) 150/82  Pulse: 75  SpO2: 98%  Weight: 133 lb (60.3 kg)  Height: 5' 5.5" (1.664 m)    GEN:  The patient appears stated age and is in NAD. HEENT:  Normocephalic, atraumatic.  The mucous membranes are moist.  Neurological examination: CV:  RRR Lungs:  CTAB   Orientation: The patient is alert and oriented x3. Cranial nerves: There is good facial symmetry with mild facial hypomimia. The speech is fluent and clear. Soft palate rises symmetrically and there is no tongue deviation. Hearing is intact to conversational tone. Sensation: Sensation is intact to light touch throughout Motor: Strength is at least antigravity x4.  Movement examination: Tone: There is normal tone in the upper and lower extremities today.  She  Abnormal movements: no rest tremor.  Min postural tremor.   Coordination:  There is no decremation, with any form of RAMS, including alternating supination and pronation of the forearm, hand opening and closing, finger taps, heel taps and toe taps. Gait and Station: The patient has no difficulty arising out of a deep-seated chair without the use of the hands. 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  Gait is antalgic today.  I have reviewed and interpreted the following labs independently    Chemistry      Component Value Date/Time   NA 143 09/20/2023 1240   K 4.9 09/20/2023 1240   CL 103 09/20/2023 1240   CO2 24 09/20/2023 1240   BUN 17 09/20/2023 1240    CREATININE 0.79 09/20/2023 1240      Component Value Date/Time   CALCIUM  9.4 09/20/2023 1240   ALKPHOS 102 09/20/2023 1240   AST 19 09/20/2023 1240   ALT 8 09/20/2023 1240   BILITOT 0.3 09/20/2023 1240       Lab Results  Component Value Date   WBC 7.8 09/20/2023   HGB 13.6 09/20/2023   HCT 42.2 09/20/2023   MCV 92 09/20/2023   PLT 250 09/20/2023    Lab Results  Component Value Date   TSH 1.270 09/20/2023     Cc:  White,  Adah Acron, MD

## 2024-04-21 ENCOUNTER — Encounter: Payer: Self-pay | Admitting: Neurology

## 2024-04-21 ENCOUNTER — Ambulatory Visit: Payer: Medicare PPO | Admitting: Neurology

## 2024-04-21 VITALS — BP 118/71 | HR 75 | Ht 65.5 in | Wt 145.0 lb

## 2024-04-21 DIAGNOSIS — G4733 Obstructive sleep apnea (adult) (pediatric): Secondary | ICD-10-CM

## 2024-04-21 DIAGNOSIS — G250 Essential tremor: Secondary | ICD-10-CM | POA: Diagnosis not present

## 2024-04-21 DIAGNOSIS — G20A1 Parkinson's disease without dyskinesia, without mention of fluctuations: Secondary | ICD-10-CM

## 2024-04-21 DIAGNOSIS — M5416 Radiculopathy, lumbar region: Secondary | ICD-10-CM | POA: Diagnosis not present

## 2024-04-21 DIAGNOSIS — M533 Sacrococcygeal disorders, not elsewhere classified: Secondary | ICD-10-CM | POA: Diagnosis not present

## 2024-04-21 MED ORDER — CARBIDOPA-LEVODOPA 25-100 MG PO TABS: ORAL_TABLET | ORAL | Status: DC

## 2024-04-21 NOTE — Patient Instructions (Addendum)
 Increase carbidopa /levodopa  25/100, 1.5 tablets at 7 AM/2 tablet at 11 AM/2 tablet at 3 PM/1 tablet at 7 PM

## 2024-04-21 NOTE — Addendum Note (Signed)
 Addended by: Fran Imus S on: 04/21/2024 01:43 PM   Modules accepted: Level of Service

## 2024-05-19 DIAGNOSIS — D485 Neoplasm of uncertain behavior of skin: Secondary | ICD-10-CM | POA: Diagnosis not present

## 2024-05-19 DIAGNOSIS — D225 Melanocytic nevi of trunk: Secondary | ICD-10-CM | POA: Diagnosis not present

## 2024-05-19 DIAGNOSIS — D2262 Melanocytic nevi of left upper limb, including shoulder: Secondary | ICD-10-CM | POA: Diagnosis not present

## 2024-05-19 DIAGNOSIS — L821 Other seborrheic keratosis: Secondary | ICD-10-CM | POA: Diagnosis not present

## 2024-05-19 DIAGNOSIS — L309 Dermatitis, unspecified: Secondary | ICD-10-CM | POA: Diagnosis not present

## 2024-05-19 DIAGNOSIS — L570 Actinic keratosis: Secondary | ICD-10-CM | POA: Diagnosis not present

## 2024-05-19 DIAGNOSIS — X32XXXA Exposure to sunlight, initial encounter: Secondary | ICD-10-CM | POA: Diagnosis not present

## 2024-05-19 DIAGNOSIS — C44311 Basal cell carcinoma of skin of nose: Secondary | ICD-10-CM | POA: Diagnosis not present

## 2024-05-19 DIAGNOSIS — L814 Other melanin hyperpigmentation: Secondary | ICD-10-CM | POA: Diagnosis not present

## 2024-05-19 DIAGNOSIS — D2271 Melanocytic nevi of right lower limb, including hip: Secondary | ICD-10-CM | POA: Diagnosis not present

## 2024-05-31 ENCOUNTER — Other Ambulatory Visit: Payer: Self-pay | Admitting: Neurology

## 2024-05-31 DIAGNOSIS — G250 Essential tremor: Secondary | ICD-10-CM

## 2024-05-31 DIAGNOSIS — G20A1 Parkinson's disease without dyskinesia, without mention of fluctuations: Secondary | ICD-10-CM

## 2024-06-03 ENCOUNTER — Other Ambulatory Visit: Payer: Self-pay | Admitting: Neurology

## 2024-06-03 DIAGNOSIS — G250 Essential tremor: Secondary | ICD-10-CM

## 2024-06-03 DIAGNOSIS — G20A1 Parkinson's disease without dyskinesia, without mention of fluctuations: Secondary | ICD-10-CM

## 2024-06-16 ENCOUNTER — Other Ambulatory Visit: Payer: Self-pay

## 2024-06-16 ENCOUNTER — Encounter: Payer: Self-pay | Admitting: Neurology

## 2024-06-16 DIAGNOSIS — G20A1 Parkinson's disease without dyskinesia, without mention of fluctuations: Secondary | ICD-10-CM

## 2024-06-16 DIAGNOSIS — G250 Essential tremor: Secondary | ICD-10-CM

## 2024-06-16 MED ORDER — CARBIDOPA-LEVODOPA 25-100 MG PO TABS: ORAL_TABLET | ORAL | 0 refills | Status: DC

## 2024-06-25 DIAGNOSIS — Z1231 Encounter for screening mammogram for malignant neoplasm of breast: Secondary | ICD-10-CM | POA: Diagnosis not present

## 2024-06-25 DIAGNOSIS — M8588 Other specified disorders of bone density and structure, other site: Secondary | ICD-10-CM | POA: Diagnosis not present

## 2024-06-25 DIAGNOSIS — R29890 Loss of height: Secondary | ICD-10-CM | POA: Diagnosis not present

## 2024-07-30 ENCOUNTER — Other Ambulatory Visit: Payer: Self-pay | Admitting: Neurology

## 2024-07-30 DIAGNOSIS — G250 Essential tremor: Secondary | ICD-10-CM

## 2024-07-30 DIAGNOSIS — G20A1 Parkinson's disease without dyskinesia, without mention of fluctuations: Secondary | ICD-10-CM

## 2024-08-02 ENCOUNTER — Other Ambulatory Visit: Payer: Self-pay | Admitting: Neurology

## 2024-08-02 DIAGNOSIS — G250 Essential tremor: Secondary | ICD-10-CM

## 2024-08-06 DIAGNOSIS — R0981 Nasal congestion: Secondary | ICD-10-CM | POA: Diagnosis not present

## 2024-08-06 DIAGNOSIS — J069 Acute upper respiratory infection, unspecified: Secondary | ICD-10-CM | POA: Diagnosis not present

## 2024-08-06 DIAGNOSIS — R0982 Postnasal drip: Secondary | ICD-10-CM | POA: Diagnosis not present

## 2024-08-06 DIAGNOSIS — R051 Acute cough: Secondary | ICD-10-CM | POA: Diagnosis not present

## 2024-08-07 DIAGNOSIS — M8580 Other specified disorders of bone density and structure, unspecified site: Secondary | ICD-10-CM | POA: Diagnosis not present

## 2024-08-07 DIAGNOSIS — S20212A Contusion of left front wall of thorax, initial encounter: Secondary | ICD-10-CM | POA: Diagnosis not present

## 2024-08-11 DIAGNOSIS — S298XXD Other specified injuries of thorax, subsequent encounter: Secondary | ICD-10-CM | POA: Diagnosis not present

## 2024-08-11 DIAGNOSIS — W19XXXD Unspecified fall, subsequent encounter: Secondary | ICD-10-CM | POA: Diagnosis not present

## 2024-08-11 DIAGNOSIS — G20A1 Parkinson's disease without dyskinesia, without mention of fluctuations: Secondary | ICD-10-CM | POA: Diagnosis not present

## 2024-08-11 DIAGNOSIS — J0110 Acute frontal sinusitis, unspecified: Secondary | ICD-10-CM | POA: Diagnosis not present

## 2024-08-11 DIAGNOSIS — L509 Urticaria, unspecified: Secondary | ICD-10-CM | POA: Diagnosis not present

## 2024-08-13 ENCOUNTER — Encounter: Payer: Self-pay | Admitting: Neurology

## 2024-08-15 DIAGNOSIS — M1712 Unilateral primary osteoarthritis, left knee: Secondary | ICD-10-CM | POA: Diagnosis not present

## 2024-08-15 DIAGNOSIS — M25562 Pain in left knee: Secondary | ICD-10-CM | POA: Diagnosis not present

## 2024-08-17 DIAGNOSIS — K5900 Constipation, unspecified: Secondary | ICD-10-CM | POA: Diagnosis not present

## 2024-08-17 DIAGNOSIS — R109 Unspecified abdominal pain: Secondary | ICD-10-CM | POA: Diagnosis not present

## 2024-08-17 DIAGNOSIS — G20A1 Parkinson's disease without dyskinesia, without mention of fluctuations: Secondary | ICD-10-CM | POA: Diagnosis not present

## 2024-08-17 DIAGNOSIS — K644 Residual hemorrhoidal skin tags: Secondary | ICD-10-CM | POA: Diagnosis not present

## 2024-08-18 DIAGNOSIS — M7052 Other bursitis of knee, left knee: Secondary | ICD-10-CM | POA: Diagnosis not present

## 2024-08-18 DIAGNOSIS — M1712 Unilateral primary osteoarthritis, left knee: Secondary | ICD-10-CM | POA: Diagnosis not present

## 2024-08-20 DIAGNOSIS — L309 Dermatitis, unspecified: Secondary | ICD-10-CM | POA: Diagnosis not present

## 2024-08-20 DIAGNOSIS — S90821A Blister (nonthermal), right foot, initial encounter: Secondary | ICD-10-CM | POA: Diagnosis not present

## 2024-08-20 DIAGNOSIS — Z08 Encounter for follow-up examination after completed treatment for malignant neoplasm: Secondary | ICD-10-CM | POA: Diagnosis not present

## 2024-08-20 DIAGNOSIS — Z86007 Personal history of in-situ neoplasm of skin: Secondary | ICD-10-CM | POA: Diagnosis not present

## 2024-08-20 DIAGNOSIS — L981 Factitial dermatitis: Secondary | ICD-10-CM | POA: Diagnosis not present

## 2024-08-20 DIAGNOSIS — D485 Neoplasm of uncertain behavior of skin: Secondary | ICD-10-CM | POA: Diagnosis not present

## 2024-08-20 DIAGNOSIS — B078 Other viral warts: Secondary | ICD-10-CM | POA: Diagnosis not present

## 2024-08-20 DIAGNOSIS — L814 Other melanin hyperpigmentation: Secondary | ICD-10-CM | POA: Diagnosis not present

## 2024-08-20 DIAGNOSIS — L821 Other seborrheic keratosis: Secondary | ICD-10-CM | POA: Diagnosis not present

## 2024-08-21 DIAGNOSIS — Z96651 Presence of right artificial knee joint: Secondary | ICD-10-CM | POA: Diagnosis not present

## 2024-08-21 DIAGNOSIS — M1712 Unilateral primary osteoarthritis, left knee: Secondary | ICD-10-CM | POA: Diagnosis not present

## 2024-08-21 DIAGNOSIS — M25562 Pain in left knee: Secondary | ICD-10-CM | POA: Diagnosis not present

## 2024-08-21 DIAGNOSIS — M25561 Pain in right knee: Secondary | ICD-10-CM | POA: Diagnosis not present

## 2024-08-24 ENCOUNTER — Other Ambulatory Visit: Payer: Self-pay | Admitting: Neurology

## 2024-08-24 DIAGNOSIS — G20A1 Parkinson's disease without dyskinesia, without mention of fluctuations: Secondary | ICD-10-CM

## 2024-08-24 DIAGNOSIS — G250 Essential tremor: Secondary | ICD-10-CM

## 2024-08-25 ENCOUNTER — Other Ambulatory Visit: Payer: Self-pay | Admitting: Neurology

## 2024-08-25 DIAGNOSIS — G250 Essential tremor: Secondary | ICD-10-CM

## 2024-08-25 DIAGNOSIS — G20A1 Parkinson's disease without dyskinesia, without mention of fluctuations: Secondary | ICD-10-CM

## 2024-08-29 DIAGNOSIS — K5900 Constipation, unspecified: Secondary | ICD-10-CM | POA: Diagnosis not present

## 2024-08-29 DIAGNOSIS — G479 Sleep disorder, unspecified: Secondary | ICD-10-CM | POA: Diagnosis not present

## 2024-08-29 DIAGNOSIS — R0602 Shortness of breath: Secondary | ICD-10-CM | POA: Diagnosis not present

## 2024-08-29 DIAGNOSIS — Z23 Encounter for immunization: Secondary | ICD-10-CM | POA: Diagnosis not present

## 2024-09-03 DIAGNOSIS — M5416 Radiculopathy, lumbar region: Secondary | ICD-10-CM | POA: Diagnosis not present

## 2024-09-05 DIAGNOSIS — C44311 Basal cell carcinoma of skin of nose: Secondary | ICD-10-CM | POA: Diagnosis not present

## 2024-09-17 DIAGNOSIS — M4726 Other spondylosis with radiculopathy, lumbar region: Secondary | ICD-10-CM | POA: Diagnosis not present

## 2024-09-18 DIAGNOSIS — M6281 Muscle weakness (generalized): Secondary | ICD-10-CM | POA: Diagnosis not present

## 2024-09-18 DIAGNOSIS — M25551 Pain in right hip: Secondary | ICD-10-CM | POA: Diagnosis not present

## 2024-09-18 DIAGNOSIS — M79604 Pain in right leg: Secondary | ICD-10-CM | POA: Diagnosis not present

## 2024-09-18 DIAGNOSIS — R2681 Unsteadiness on feet: Secondary | ICD-10-CM | POA: Diagnosis not present

## 2024-09-22 DIAGNOSIS — H00025 Hordeolum internum left lower eyelid: Secondary | ICD-10-CM | POA: Diagnosis not present

## 2024-09-23 DIAGNOSIS — M6281 Muscle weakness (generalized): Secondary | ICD-10-CM | POA: Diagnosis not present

## 2024-09-23 DIAGNOSIS — M25551 Pain in right hip: Secondary | ICD-10-CM | POA: Diagnosis not present

## 2024-09-23 DIAGNOSIS — R2681 Unsteadiness on feet: Secondary | ICD-10-CM | POA: Diagnosis not present

## 2024-09-23 DIAGNOSIS — M79604 Pain in right leg: Secondary | ICD-10-CM | POA: Diagnosis not present

## 2024-09-23 NOTE — Progress Notes (Unsigned)
 Cardiology Office Note   Date:  09/26/2024  ID:  PIETRINA JAGODZINSKI, DOB February 07, 1946, MRN [id number] PCP: Teresa Channel, MD  [facility] Providers Cardiologist:  Wilbert Bihari, MD     History of Present Illness Katie Daniel is a 78 y.o. 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 Bihari on 03/07/2021 at the behest of her PCP for premature family history of CAD.  A calcium  score was arranged which was 34.4, 54th percentile.  In 2023 she was evaluated by Dr. Waddell for syncopal episodes, she wore a monitor which was overall reassuring and it was felt it was most likely related to her Parkinson's disease and concurrent dehydration.  Most recently she was evaluated by Raphael Bring PA on 09/20/2023, she was doing well at that time, was bothered by daytime fatigue, snoring and some DOE.  An echocardiogram was arranged revealing a EF of 60 to 65%, grade 1 DD, mild biatrial enlargement trivial MR.  An at home Itamar study was arranged which was positive for OSA with recommendations to start CPAP.  She presents today for follow-up, has been doing okay from a cardiac perspective since last evaluated in our office.  Her biggest complaint today is low energy/fatigue.  She has apparently lost about 12 pounds over the last year unintentionally, just does not feel like she has as good as an appetite as she previously did.  She is currently working with PT for weightbearing exercises.  She is independent, lives alone, denies any recent falls. She denies chest pain, palpitations, dyspnea, pnd, orthopnea, n, v,  dizziness, syncope, edema, weight gain, or early satiety.     ROS: Review of Systems  Constitutional:  Positive for malaise/fatigue and weight loss.  All other systems reviewed and are negative.    Studies Reviewed EKG Interpretation Date/Time:  Friday September 26 2024 11:24:02 EDT Ventricular Rate:  78 PR Interval:  144 QRS Duration:  104 QT Interval:  400 QTC Calculation: 456 R Axis:   48  Text Interpretation: Normal sinus rhythm Minimal voltage criteria for LVH, may be normal variant ( Sokolow-Lyon ) When compared with ECG of 20-Sep-2023 10:47, No significant change was found Confirmed by Carlin Nest [phone]) on 09/26/2024 11:31:46 AM    Cardiac Studies & Procedures   ______________________________________________________________________________________________     ECHOCARDIOGRAM  ECHOCARDIOGRAM COMPLETE 10/22/2023  Narrative ECHOCARDIOGRAM REPORT    Patient Name:   Katie Daniel Date of Exam: 10/22/2023 Medical Rec #:  [id number]        Height:       66.0 in Accession #:    [id number]       Weight:       151.2 lb Date of Birth:  1946/05/30         BSA:          1.776 m Patient Age:    77 years         BP:           120/72 mmHg Patient Gender: F                HR:  71 bpm. Exam Location:  [address]  Procedure: 2D Echo, Cardiac Doppler and Color Doppler  Indications:    R55 Syncope  History:        Patient has no prior history of Echocardiogram examinations. Arrythmias:PVC and PAC, Signs/Symptoms:Syncope, Fatigue and Hypotension; Risk Factors:Family History of Coronary Artery Disease. Coronary Calcifications, SVT, Palpitations, Parkinson's Disease.  Sonographer:    Heather Hawks RDCS Referring Phys: DAYNA N DUNN  IMPRESSIONS   1. Left ventricular ejection fraction, by estimation, is 60 to 65%. The left ventricle has normal function. The left ventricle has no regional wall motion abnormalities. Left ventricular diastolic parameters are  consistent with Grade I diastolic dysfunction (impaired relaxation). 2. Right ventricular systolic function is normal. The right ventricular size is normal. 3. Left atrial size was mildly dilated. 4. Right atrial size was mildly dilated. 5. The mitral valve is normal in structure. Trivial mitral valve regurgitation. 6. The aortic valve is tricuspid. There is mild calcification of the aortic valve. Aortic valve regurgitation is trivial. Aortic valve sclerosis/calcification is present, without any evidence of aortic stenosis.  FINDINGS Left Ventricle: Left ventricular ejection fraction, by estimation, is 60 to 65%. The left ventricle has normal function. The left ventricle has no regional wall motion abnormalities. The left ventricular internal cavity size was normal in size. There is no left ventricular hypertrophy. Left ventricular diastolic parameters are consistent with Grade I diastolic dysfunction (impaired relaxation). Normal left ventricular filling pressure.  Right Ventricle: The right ventricular size is normal. No increase in right ventricular wall thickness. Right ventricular systolic function is normal.  Left Atrium: Left atrial size was mildly dilated.  Right Atrium: Right atrial size was mildly dilated.  Pericardium: There is no evidence of pericardial effusion.  Mitral Valve: The mitral valve is normal in structure. Trivial mitral valve regurgitation.  Tricuspid Valve: The tricuspid valve is normal in structure. Tricuspid valve regurgitation is mild.  Aortic Valve: The aortic valve is tricuspid. There is mild calcification of the aortic valve. Aortic valve regurgitation is trivial. Aortic valve sclerosis/calcification is present, without any evidence of aortic stenosis.  Pulmonic Valve: The pulmonic valve was grossly normal. Pulmonic valve regurgitation is mild. No evidence of pulmonic stenosis.  Aorta: The aortic root is normal in size and structure.  IAS/Shunts: No  atrial level shunt detected by color flow Doppler.   LEFT VENTRICLE PLAX 2D LVIDd:         3.40 cm   Diastology LVIDs:         2.20 cm   LV e' medial:    5.77 cm/s LV PW:         1.00 cm   LV E/e' medial:  9.4 LV IVS:        1.00 cm   LV e' lateral:   7.07 cm/s LVOT diam:     2.00 cm   LV E/e' lateral: 7.7 LV SV:         47 LV SV Index:   27 LVOT Area:     3.14 cm   RIGHT VENTRICLE RV Basal diam:  3.00 cm RV S prime:     14.10 cm/s TAPSE (M-mode): 2.0 cm RVSP:           23.2 mmHg  LEFT ATRIUM             Index        RIGHT ATRIUM           Index LA diam:  3.60 cm 2.03 cm/m   RA Pressure: 3.00 mmHg LA Vol (A2C):   58.1 ml 32.72 ml/m  RA Area:     18.19 cm 10.24 cm/m LA Vol (A4C):   40.1 ml 22.58 ml/m  RA Volume:   46.53 ml  26.21 ml/m LA Biplane Vol: 48.2 ml 27.14 ml/m AORTIC VALVE LVOT Vmax:   69.35 cm/s LVOT Vmean:  44.300 cm/s LVOT VTI:    0.151 m  AORTA Ao Root diam: 3.00 cm Ao Asc diam:  3.30 cm  MITRAL VALVE               TRICUSPID VALVE MV Area (PHT): cm         TR Peak grad:   20.2 mmHg MV Decel Time: 288 msec    TR Vmax:        225.00 cm/s MV E velocity: 54.30 cm/s  Estimated RAP:  3.00 mmHg MV A velocity: 86.90 cm/s  RVSP:           23.2 mmHg MV E/A ratio:  0.62 SHUNTS Systemic VTI:  0.15 m Systemic Diam: 2.00 cm  Mihai Croitoru MD Electronically signed by Jerel Balding MD Signature Date/Time: 10/22/2023/3:51:26 PM    Final    MONITORS  LONG TERM MONITOR (3-14 DAYS) 07/27/2022  Narrative NSR with sinus brady and sinus tachycardia Rare and very brief bursts of NS SVT, lasting up to 12 seconds. Rare PVC's and PAC's No atrial fib or VT was present.  Gregg Taylor,MD  Patch Wear Time:  13 days and 14 hours (2023-08-04T17:32:38-0400 to 2023-08-18T07:45:06-399)  Patient had a min HR of 48 bpm, max HR of 176 bpm, and avg HR of 73 bpm. Predominant underlying rhythm was Sinus Rhythm. 39 Supraventricular Tachycardia runs occurred, the  run with the fastest interval lasting 19 beats with a max rate of 176 bpm, the longest lasting 12.0 secs with an avg rate of 111 bpm. Isolated SVEs were rare (<1.0%), SVE Couplets were rare (<1.0%), and SVE Triplets were rare (<1.0%). Isolated VEs were rare (<1.0%), VE Couplets were rare (<1.0%), and no VE Triplets were present. Ventricular Bigeminy and Trigeminy were present.   CT SCANS  CT CARDIAC SCORING (SELF PAY ONLY) 04/12/2021  Addendum 04/12/2021  3:24 PM ADDENDUM REPORT: 04/12/2021 15:22  EXAM: OVER-READ INTERPRETATION  CT CHEST  The following report is an over-read performed by radiologist Dr. Marcey Diones [facility] on 04/12/2021. This over-read does not include interpretation of cardiac or coronary anatomy or pathology. The coronary calcium  score interpretation by the cardiologist is attached.  COMPARISON:  None.  FINDINGS: Mild atherosclerosis of the visualized thoracic aorta without aneurysmal disease. Visualized mediastinum and hilar regions demonstrate no lymphadenopathy or masses. Visualized lungs show no evidence of pulmonary edema, consolidation, pneumothorax, nodule or pleural fluid. Visualized upper abdomen and bony structures are unremarkable.  IMPRESSION: Aortic atherosclerosis without visualized aneurysmal disease.   Electronically Signed By: Marcey Moan M.D. On: 04/12/2021 15:22  Narrative CLINICAL DATA:  Cardiovascular Disease Risk stratification  EXAM: Coronary Calcium  Score  TECHNIQUE: A gated, non-contrast computed tomography scan of the heart was performed using 3mm slice thickness. Axial images were analyzed on a dedicated workstation. Calcium  scoring of the coronary arteries was performed using the Agatston method.  FINDINGS: Coronary arteries: Normal origins.  Coronary Calcium  Score:  Left main: 0  Left anterior descending artery: 34.4  Left circumflex artery: 0  Right coronary artery: 0  Total:  34.4  Percentile: 45th  Pericardium: Normal.  Ascending Aorta: Normal  caliber.  Scattered calcifications.  Non-cardiac: See separate report from [facility].  IMPRESSION: Coronary calcium  score of 34.4. This was 45th percentile for age-, race-, and sex-matched controls.  RECOMMENDATIONS: Coronary artery calcium  (CAC) score is a strong predictor of incident coronary heart disease (CHD) and provides predictive information beyond traditional risk factors. CAC scoring is reasonable to use in the decision to withhold, postpone, or initiate statin therapy in intermediate-risk or selected borderline-risk asymptomatic adults (age 58-75 years and LDL-C >=70 to <190 mg/dL) who do not have diabetes or established atherosclerotic cardiovascular disease (ASCVD).* In intermediate-risk (10-year ASCVD risk >=7.5% to <20%) adults or selected borderline-risk (10-year ASCVD risk >=5% to <7.5%) adults in whom a CAC score is measured for the purpose of making a treatment decision the following recommendations have been made:  If CAC=0, it is reasonable to withhold statin therapy and reassess in 5 to 10 years, as long as higher risk conditions are absent (diabetes mellitus, family history of premature CHD in first degree relatives (males <55 years; females <65 years), cigarette smoking, or LDL >=190 mg/dL).  If CAC is 1 to 99, it is reasonable to initiate statin therapy for patients >=29 years of age.  If CAC is >=100 or >=75th percentile, it is reasonable to initiate statin therapy at any age.  Cardiology referral should be considered for patients with CAC scores >=400 or >=75th percentile.  *2018 AHA/ACC/AACVPR/AAPA/ABC/ACPM/ADA/AGS/APhA/ASPC/NLA/PCNA Guideline on the Management of Blood Cholesterol: A Report of the American College of Cardiology/American Heart Association Task Force on Clinical Practice Guidelines. J Am Coll Cardiol. 2019;73(24):3168-3209.  Wilbert Bihari,  MD  Electronically Signed: By: Wilbert Bihari On: 04/12/2021 13:32     ______________________________________________________________________________________________      Risk Assessment/Calculations       STOP-Bang Score:         Physical Exam VS:  BP (!) 96/58   Pulse 78   Ht 5' 5.5 (1.664 m)   Wt 139 lb 3.2 oz (63.1 kg)   SpO2 98%   BMI 22.81 kg/m        Wt Readings from Last 3 Encounters:  09/26/24 139 lb 3.2 oz (63.1 kg)  04/21/24 145 lb (65.8 kg)  11/29/23 151 lb 8 oz (68.7 kg)    GEN: Well nourished, well developed in no acute distress NECK: No JVD; No carotid bruits CARDIAC: RRR, no murmurs, rubs, gallops RESPIRATORY:  Clear to auscultation without rales, wheezing or rhonchi  ABDOMEN: Soft, non-tender, non-distended EXTREMITIES:  No edema; No deformity   ASSESSMENT AND PLAN Coronary artery calcification-noted on calcium  score. Stable with no anginal symptoms. No indication for ischemic evaluation.  Continue Lipitor 20 mg daily. Heart healthy diet and regular cardiovascular exercise encouraged.    Palpitations-currently quiescent.  Fatigue-likely multifactorial, her blood pressure is on the low normal side today at 96/58, she mentions she also has low readings at home which could be contributing to her fatigue.  This might be in the setting of her weight loss as well as Parkinson's disease.  Encouraged her to increase her oral hydration, liberalize her salt intake, compression socks.  Will have her keep a blood pressure log for 2 weeks.  Will check labs.  History of syncope and collapse-this was felt to be secondary to Parkinson's disease with dehydration.  OSA-previous recommendations for CPAP however she says she cannot tolerate the mask and was not sure who she should follow-up with.  Will reach out to our sleep team to see if any titrations need to be made.  Dyslipidemia-currently on Lipitor 20 mg daily, most recent LDL controlled at 65.       Dispo: CMET,  CBC, thyroid  panel, vitamin D, blood pressure log for 2 weeks, follow-up in 6 months.  Signed, Delon JAYSON Hoover, NP

## 2024-09-24 DIAGNOSIS — M8588 Other specified disorders of bone density and structure, other site: Secondary | ICD-10-CM | POA: Diagnosis not present

## 2024-09-25 DIAGNOSIS — R2681 Unsteadiness on feet: Secondary | ICD-10-CM | POA: Diagnosis not present

## 2024-09-25 DIAGNOSIS — M79604 Pain in right leg: Secondary | ICD-10-CM | POA: Diagnosis not present

## 2024-09-25 DIAGNOSIS — M6281 Muscle weakness (generalized): Secondary | ICD-10-CM | POA: Diagnosis not present

## 2024-09-25 DIAGNOSIS — M25551 Pain in right hip: Secondary | ICD-10-CM | POA: Diagnosis not present

## 2024-09-26 ENCOUNTER — Encounter: Payer: Self-pay | Admitting: Cardiology

## 2024-09-26 ENCOUNTER — Ambulatory Visit: Attending: Cardiology | Admitting: Cardiology

## 2024-09-26 VITALS — BP 96/58 | HR 78 | Ht 65.5 in | Wt 139.2 lb

## 2024-09-26 DIAGNOSIS — G4733 Obstructive sleep apnea (adult) (pediatric): Secondary | ICD-10-CM

## 2024-09-26 DIAGNOSIS — R002 Palpitations: Secondary | ICD-10-CM

## 2024-09-26 DIAGNOSIS — I2510 Atherosclerotic heart disease of native coronary artery without angina pectoris: Secondary | ICD-10-CM

## 2024-09-26 DIAGNOSIS — I959 Hypotension, unspecified: Secondary | ICD-10-CM

## 2024-09-26 DIAGNOSIS — Z87898 Personal history of other specified conditions: Secondary | ICD-10-CM | POA: Diagnosis not present

## 2024-09-26 DIAGNOSIS — R5383 Other fatigue: Secondary | ICD-10-CM

## 2024-09-26 NOTE — Patient Instructions (Addendum)
 Medication Instructions:   Your physician recommends that you continue on your current medications as directed. Please refer to the Current Medication list given to you today.     FOR TWO WEEK S :  MAKE SURE YOU TAKE YOUR BLOOD PRESSURE  AT  LEAST  30-40 MINUTES AFTER TAKING MEDICINES  AN D  SENT TO YOUR MY CHART   ATTENTION  JENNIFER WOODY   NP   *If you need a refill on your cardiac medications before your next appointment, please call your pharmacy*   Lab Work  PLEASE GO DOWN STAIRS  [facility]  FIRST FLOOR   ( GET OFF ELEVATORS WALK TOWARDS WAITING AREA LAB LOCATED BY PHARMACY):  CMET  CBC AND TSH FREE T4 AND 3      If you have labs (blood work) drawn today and your tests are completely normal, you will receive your results only by: MyChart Message (if you have MyChart) OR A paper copy in the mail If you have any lab test that is abnormal or we need to change your treatment, we will call you to review the results.    Testing/Procedures:  NONE ORDERED  TODAY      Follow-Up: At [facility], you and your health needs are our priority.  As part of our continuing mission to provide you with exceptional heart care, our providers are all part of one team.  This team includes your primary Cardiologist (physician) and Advanced Practice Providers or APPs (Physician Assistants and Nurse Practitioners) who all work together to provide you with the care you need, when you need it.  Your next appointment:   6 month(s)   Provider:    Wilbert Bihari, MD or Delon Hoover  NP           We recommend signing up for the patient portal called MyChart.  Sign up information is provided on this After Visit Summary.  MyChart is used to connect with patients for Virtual Visits (Telemedicine).  Patients are able to view lab/test results, encounter notes, upcoming appointments, etc.  Non-urgent messages can be sent to your provider as well.   To learn more about what you can do with MyChart, go  to [email or web address].   Other Instructions

## 2024-09-27 LAB — CBC
Hematocrit: 33 % — ABNORMAL LOW (ref 34.0–46.6)
Hemoglobin: 10.4 g/dL — ABNORMAL LOW (ref 11.1–15.9)
MCH: 27.5 pg (ref 26.6–33.0)
MCHC: 31.5 g/dL (ref 31.5–35.7)
MCV: 87 fL (ref 79–97)
Platelets: 342 x10E3/uL (ref 150–450)
RBC: 3.78 x10E6/uL (ref 3.77–5.28)
RDW: 13.3 % (ref 11.7–15.4)
WBC: 8.3 x10E3/uL (ref 3.4–10.8)

## 2024-09-27 LAB — T4, FREE: Free T4: 1.11 ng/dL (ref 0.82–1.77)

## 2024-09-27 LAB — T3, FREE: T3, Free: 3 pg/mL (ref 2.0–4.4)

## 2024-09-27 LAB — VITAMIN D 25 HYDROXY (VIT D DEFICIENCY, FRACTURES): Vit D, 25-Hydroxy: 28.8 ng/mL — ABNORMAL LOW (ref 30.0–100.0)

## 2024-09-27 LAB — TSH: TSH: 1.14 u[IU]/mL (ref 0.450–4.500)

## 2024-09-29 ENCOUNTER — Ambulatory Visit: Payer: Self-pay | Admitting: Cardiology

## 2024-09-29 DIAGNOSIS — M25551 Pain in right hip: Secondary | ICD-10-CM | POA: Diagnosis not present

## 2024-09-29 DIAGNOSIS — M6281 Muscle weakness (generalized): Secondary | ICD-10-CM | POA: Diagnosis not present

## 2024-09-29 DIAGNOSIS — M79604 Pain in right leg: Secondary | ICD-10-CM | POA: Diagnosis not present

## 2024-09-29 DIAGNOSIS — R2681 Unsteadiness on feet: Secondary | ICD-10-CM | POA: Diagnosis not present

## 2024-09-29 NOTE — Telephone Encounter (Signed)
 Patient returned staff call regarding results.

## 2024-10-02 DIAGNOSIS — M25562 Pain in left knee: Secondary | ICD-10-CM | POA: Diagnosis not present

## 2024-10-02 DIAGNOSIS — Z96651 Presence of right artificial knee joint: Secondary | ICD-10-CM | POA: Diagnosis not present

## 2024-10-02 DIAGNOSIS — M25551 Pain in right hip: Secondary | ICD-10-CM | POA: Diagnosis not present

## 2024-10-02 DIAGNOSIS — M6281 Muscle weakness (generalized): Secondary | ICD-10-CM | POA: Diagnosis not present

## 2024-10-02 DIAGNOSIS — M1712 Unilateral primary osteoarthritis, left knee: Secondary | ICD-10-CM | POA: Diagnosis not present

## 2024-10-02 DIAGNOSIS — R2681 Unsteadiness on feet: Secondary | ICD-10-CM | POA: Diagnosis not present

## 2024-10-02 DIAGNOSIS — M25561 Pain in right knee: Secondary | ICD-10-CM | POA: Diagnosis not present

## 2024-10-02 DIAGNOSIS — M79604 Pain in right leg: Secondary | ICD-10-CM | POA: Diagnosis not present

## 2024-10-07 DIAGNOSIS — M25551 Pain in right hip: Secondary | ICD-10-CM | POA: Diagnosis not present

## 2024-10-07 DIAGNOSIS — M6281 Muscle weakness (generalized): Secondary | ICD-10-CM | POA: Diagnosis not present

## 2024-10-07 DIAGNOSIS — R2681 Unsteadiness on feet: Secondary | ICD-10-CM | POA: Diagnosis not present

## 2024-10-07 DIAGNOSIS — M79604 Pain in right leg: Secondary | ICD-10-CM | POA: Diagnosis not present

## 2024-10-07 DIAGNOSIS — C44311 Basal cell carcinoma of skin of nose: Secondary | ICD-10-CM | POA: Diagnosis not present

## 2024-10-08 DIAGNOSIS — M5416 Radiculopathy, lumbar region: Secondary | ICD-10-CM | POA: Diagnosis not present

## 2024-10-09 DIAGNOSIS — M25562 Pain in left knee: Secondary | ICD-10-CM | POA: Diagnosis not present

## 2024-10-09 DIAGNOSIS — Z96651 Presence of right artificial knee joint: Secondary | ICD-10-CM | POA: Diagnosis not present

## 2024-10-09 DIAGNOSIS — M25551 Pain in right hip: Secondary | ICD-10-CM | POA: Diagnosis not present

## 2024-10-09 DIAGNOSIS — M1712 Unilateral primary osteoarthritis, left knee: Secondary | ICD-10-CM | POA: Diagnosis not present

## 2024-10-09 DIAGNOSIS — R2681 Unsteadiness on feet: Secondary | ICD-10-CM | POA: Diagnosis not present

## 2024-10-09 DIAGNOSIS — M79604 Pain in right leg: Secondary | ICD-10-CM | POA: Diagnosis not present

## 2024-10-09 DIAGNOSIS — M25561 Pain in right knee: Secondary | ICD-10-CM | POA: Diagnosis not present

## 2024-10-09 DIAGNOSIS — M6281 Muscle weakness (generalized): Secondary | ICD-10-CM | POA: Diagnosis not present

## 2024-10-14 DIAGNOSIS — R2681 Unsteadiness on feet: Secondary | ICD-10-CM | POA: Diagnosis not present

## 2024-10-14 DIAGNOSIS — M79604 Pain in right leg: Secondary | ICD-10-CM | POA: Diagnosis not present

## 2024-10-14 DIAGNOSIS — M25551 Pain in right hip: Secondary | ICD-10-CM | POA: Diagnosis not present

## 2024-10-14 DIAGNOSIS — M6281 Muscle weakness (generalized): Secondary | ICD-10-CM | POA: Diagnosis not present

## 2024-10-16 DIAGNOSIS — M1712 Unilateral primary osteoarthritis, left knee: Secondary | ICD-10-CM | POA: Diagnosis not present

## 2024-10-16 DIAGNOSIS — Z96651 Presence of right artificial knee joint: Secondary | ICD-10-CM | POA: Diagnosis not present

## 2024-10-16 DIAGNOSIS — M25561 Pain in right knee: Secondary | ICD-10-CM | POA: Diagnosis not present

## 2024-10-16 DIAGNOSIS — M79604 Pain in right leg: Secondary | ICD-10-CM | POA: Diagnosis not present

## 2024-10-16 DIAGNOSIS — M6281 Muscle weakness (generalized): Secondary | ICD-10-CM | POA: Diagnosis not present

## 2024-10-16 DIAGNOSIS — R2681 Unsteadiness on feet: Secondary | ICD-10-CM | POA: Diagnosis not present

## 2024-10-16 DIAGNOSIS — M25551 Pain in right hip: Secondary | ICD-10-CM | POA: Diagnosis not present

## 2024-10-16 DIAGNOSIS — M25562 Pain in left knee: Secondary | ICD-10-CM | POA: Diagnosis not present

## 2024-10-21 DIAGNOSIS — M79604 Pain in right leg: Secondary | ICD-10-CM | POA: Diagnosis not present

## 2024-10-21 DIAGNOSIS — R2681 Unsteadiness on feet: Secondary | ICD-10-CM | POA: Diagnosis not present

## 2024-10-21 DIAGNOSIS — M25551 Pain in right hip: Secondary | ICD-10-CM | POA: Diagnosis not present

## 2024-10-21 DIAGNOSIS — M5416 Radiculopathy, lumbar region: Secondary | ICD-10-CM | POA: Diagnosis not present

## 2024-10-21 DIAGNOSIS — M6281 Muscle weakness (generalized): Secondary | ICD-10-CM | POA: Diagnosis not present

## 2024-10-23 DIAGNOSIS — R2681 Unsteadiness on feet: Secondary | ICD-10-CM | POA: Diagnosis not present

## 2024-10-23 DIAGNOSIS — M6281 Muscle weakness (generalized): Secondary | ICD-10-CM | POA: Diagnosis not present

## 2024-10-23 DIAGNOSIS — M79604 Pain in right leg: Secondary | ICD-10-CM | POA: Diagnosis not present

## 2024-10-23 DIAGNOSIS — M25551 Pain in right hip: Secondary | ICD-10-CM | POA: Diagnosis not present

## 2024-10-28 DIAGNOSIS — R2681 Unsteadiness on feet: Secondary | ICD-10-CM | POA: Diagnosis not present

## 2024-10-28 DIAGNOSIS — M79604 Pain in right leg: Secondary | ICD-10-CM | POA: Diagnosis not present

## 2024-10-28 DIAGNOSIS — M6281 Muscle weakness (generalized): Secondary | ICD-10-CM | POA: Diagnosis not present

## 2024-11-04 DIAGNOSIS — M25551 Pain in right hip: Secondary | ICD-10-CM | POA: Diagnosis not present

## 2024-11-04 DIAGNOSIS — M6281 Muscle weakness (generalized): Secondary | ICD-10-CM | POA: Diagnosis not present

## 2024-11-04 DIAGNOSIS — M79604 Pain in right leg: Secondary | ICD-10-CM | POA: Diagnosis not present

## 2024-11-04 DIAGNOSIS — M5416 Radiculopathy, lumbar region: Secondary | ICD-10-CM | POA: Diagnosis not present

## 2024-11-04 DIAGNOSIS — R2681 Unsteadiness on feet: Secondary | ICD-10-CM | POA: Diagnosis not present

## 2024-11-04 NOTE — Progress Notes (Unsigned)
 Assessment/Plan:  Assessment and Plan Assessment & Plan ET/Parkinsons Disease -Patient initially diagnosed with Parkinson's disease in 2014 by Dr. Ruthine.  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 Glendia in 2018. 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  Discussed good prognosis with this Increased rigidity and stiffness in the late afternoon. Occasional freezing when changing surfaces.  - Adjusted carbidopa -levodopa  regimen: 1.5 tablets at 7 AM, 2 tablets at 11 AM, 2 tablets at 3 PM, 1 tablet at 7 PM. - Added carbidopa -levodopa  50/200 CR at bedtime. - Continue opicapone  50 mg at bedtime, Requip  XL 8 mg daily, and primidone  50 mg twice daily. - Consider referral to physical therapy for PWR moves or LSVT big and loud programs. - Discuss potential future use of levodopa  pump if needed.  Nocturia Experiences nocturia, waking up three times at night to urinate. Effects of carbidopa -levodopa  wear off at night, leading to difficulty walking. -Discussed referral to urology.  She wants to get me the name of somebody in [place] and will let me know where to send the referral.  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 Unable to tolerate CPAP due to discomfort and frequent removal during nocturia episodes. - Continue follow-up with cardiology for sleep apnea management.  Personal history of scalp melanoma, squamous cell carcinoma, and basal cell carcinoma Under regular follow-up with [facility]. Parkinson's disease increases risk for melanoma. - Continue regular dermatology follow-ups every three months.  History of syncope Syncope episodes occurred while seated in July and December 2022. Not related to neurogenic orthostatic hypotension. Negative Zio patch results.  Anxiety symptoms Reports feeling anxious, especially in the morning and during the day. Levodopa  does not alleviate anxiety. Discussed potential  use of Buspar, a mild anti-anxiety medication. - Start Buspar at 7.5 mg twice daily.  She will start this in 2 weeks, as I want to give time to start the CR levodopa  at bedtime first - Consider counseling as a supportive measure for anxiety management.   Subjective:  Discussed the use of AI scribe software for clinical note transcription with the patient, who gave verbal consent to proceed.  History of Present Illness Katie Daniel is a 78 year old female with Parkinson's disease who presents for follow-up regarding medication management and symptom control.  She is experiencing issues with medication dosing, particularly with carbidopa /levodopa , which was adjusted last visit to address afternoon rigidity.  She noted some miscommunication and has already been taking 1.5 tablets with each dosage instead of what I intended which was 1.5 tablets at 7 AM, 2 tablets at 11 AM, 2 tablets at 3 PM, and 1 tablet at 7 PM. She also takes opicapone  50 mg at bedtime, Requip  XL 8 mg daily, and primidone  50 mg twice per day. She experiences increased rigidity and stiffness in the late afternoon and early evening, particularly around 5 to 7 PM.  She reports a fall on September 3rd, where she lost her balance while reaching over pillows but did not sustain any injury. No hallucinations or feelings of passing out. She experiences some freezing, especially when transitioning to different surfaces.  She has a history of syncope, with episodes occurring while seated in July and December 2022. Her Zio patch was negative, and she has not been orthostatic when tested.  She has a history of scalp melanoma and follows with [facility], having undergone several Mohs surgeries for squamous cell and basal cell carcinoma.  She experiences nocturia, waking up about three times per night to urinate. She notes that the effects of carbidopa /levodopa  wear off at night, making it difficult to walk. She has mild sleep apnea  with an AHI of 9.8 on a nocturnal polysomnogram from December 2024 and was unable to tolerate CPAP therapy.  She reports anxiety, which her daughter has also noticed, describing feelings of being 'anxious' and 'hurried'. She has discussed this with her pharmacist, who suggested Buspar as a potential treatment.  She has been experiencing issues with her leg and hip, for which she attends physical therapy twice a week. She has received gel shots in her left knee and an epidural for sciatic pain, with another scheduled soon. She follows with orthopedics in [place] for these issues.  Socially, she lives in [place] but frequently visits [place] to be with her daughter and grandson. She maintains a strong social support network in both locations.  Current prescribed movement disorder medications: carbidopa /levodopa  25/100, 1.5 tablets at 7 AM/2 tablet at 11 AM/2 tablet at 3 PM/1 tablet at 7 PM (what is prescribed but she is currently taking 1.5 tablets at each dosage) Requip  XL, 8 mg daily  Primidone  50 mg,  1 po bid Opicapone , 50 mg at bedtime    PREVIOUS MEDICATIONS:  amantadine (no help for tremor so d/c); carbidopa /levodopa  25/100; requip  xl; primidone ; entacapone  - given but she didn't take it; opicapone ; carbidopa /levodopa  50/200 (she took 3 dosages and d/c - no help for stutter steps at bed)   ALLERGIES:   Allergies  Allergen Reactions   Amoxicillin-Pot Clavulanate Other (See Comments)   Latex Other (See Comments)   Penicillins Hives    Did it involve swelling of the face/tongue/throat, SOB, or low BP? No Did it involve sudden or severe rash/hives, skin peeling, or any reaction on the inside of your mouth or nose? Yes Did you need to seek medical attention at a hospital or doctor's office? Yes When did it last happen?      5 years If all above answers are NO, may proceed with cephalosporin use. Tolerated ancef  03/16/20    CURRENT MEDICATIONS:  Outpatient Encounter Medications as  of 11/06/2024  Medication Sig   carbidopa -levodopa  (SINEMET  IR) 25-100 MG tablet Take 1.5 tablets at 7 AM/2 tablet at 11 AM/2 tablet at 3 PM/1 tablet at 7 PM   acetaminophen  (TYLENOL  8 HOUR) 650 MG CR tablet Take 1,300 mg by mouth every 8 (eight) hours as needed for pain or fever.   atorvastatin  (LIPITOR) 20 MG tablet Take 1 tablet (20 mg total) by mouth daily.   azithromycin (ZITHROMAX) 250 MG tablet Take 250 mg by mouth daily. Pt will stop taking after 1025/25.   cholecalciferol (VITAMIN D3) 25 MCG (1000 UNIT) tablet Take 1,000 Units by mouth daily.   Multiple Vitamins-Minerals (MULTI FOR HER 50+) TABS Take 1 tablet by mouth as needed.   ONGENTYS  50 MG CAPS TAKE 1 CAPSULE BY MOUTH DAILY   primidone  (MYSOLINE ) 50 MG tablet TAKE 1 TABLET(50 MG) BY MOUTH TWICE DAILY   rOPINIRole  (REQUIP  XL) 8 MG 24 hr tablet TAKE 1 TABLET(8 MG) BY MOUTH DAILY   No facility-administered encounter medications on file as of 11/06/2024.    Objective:   PHYSICAL EXAMINATION:    VITALS:   Vitals:   11/06/24 1022  BP: 128/76  Pulse: 80  SpO2: 98%  Weight: 140 lb 6.4 oz (63.7 kg)     GEN:  The patient appears stated age and is in  NAD. HEENT:  Normocephalic, atraumatic.  The mucous membranes are moist.  Neurological examination: CV:  RRR Lungs:  CTAB   Orientation: The patient is alert and oriented x3. Cranial nerves: There is good facial symmetry with mild facial hypomimia. The speech is fluent and clear. Soft palate rises symmetrically and there is no tongue deviation. Hearing is intact to conversational tone. Sensation: Sensation is intact to light touch throughout Motor: Strength is at least antigravity x4.  Movement examination: Tone: There is normal tone in the upper and lower extremities today.  She  Abnormal movements: no rest tremor.  Min postural tremor.   Coordination:  There is no decremation, with any form of RAMS, including alternating supination and pronation of the forearm, hand  opening and closing, finger taps, heel taps and toe taps. Gait and Station: The patient has no difficulty arising out of a deep-seated chair without the use of the hands. She has start hesitation.  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  Gait is antalgic today.  This is similar to previous.  I have reviewed and interpreted the following labs independently    Chemistry      Component Value Date/Time   NA 143 09/20/2023 1240   K 4.9 09/20/2023 1240   CL 103 09/20/2023 1240   CO2 24 09/20/2023 1240   BUN 17 09/20/2023 1240   CREATININE 0.79 09/20/2023 1240      Component Value Date/Time   CALCIUM  9.4 09/20/2023 1240   ALKPHOS 102 09/20/2023 1240   AST 19 09/20/2023 1240   ALT 8 09/20/2023 1240   BILITOT 0.3 09/20/2023 1240       Lab Results  Component Value Date   WBC 8.3 09/26/2024   HGB 10.4 (L) 09/26/2024   HCT 33.0 (L) 09/26/2024   MCV 87 09/26/2024   PLT 342 09/26/2024    Lab Results  Component Value Date   TSH 1.140 09/26/2024   Total time spent on today's visit was 42 minutes, including both face-to-face time and nonface-to-face time.  Time included that spent on review of records (prior notes available to me/labs/imaging if pertinent), discussing treatment and goals, answering patient's questions and coordinating care.   Cc:  Teresa Channel, MD

## 2024-11-06 ENCOUNTER — Ambulatory Visit: Admitting: Neurology

## 2024-11-06 ENCOUNTER — Encounter: Payer: Self-pay | Admitting: Neurology

## 2024-11-06 VITALS — BP 128/76 | HR 80 | Wt 140.4 lb

## 2024-11-06 DIAGNOSIS — F411 Generalized anxiety disorder: Secondary | ICD-10-CM | POA: Diagnosis not present

## 2024-11-06 DIAGNOSIS — G20A1 Parkinson's disease without dyskinesia, without mention of fluctuations: Secondary | ICD-10-CM | POA: Diagnosis not present

## 2024-11-06 DIAGNOSIS — G250 Essential tremor: Secondary | ICD-10-CM | POA: Diagnosis not present

## 2024-11-06 MED ORDER — CARBIDOPA-LEVODOPA ER 50-200 MG PO TBCR: 1.0000 | EXTENDED_RELEASE_TABLET | Freq: Every day | ORAL | 1 refills | Status: AC

## 2024-11-06 MED ORDER — PRIMIDONE 50 MG PO TABS: ORAL_TABLET | ORAL | 1 refills | Status: AC

## 2024-11-06 MED ORDER — ROPINIROLE HCL ER 8 MG PO TB24: ORAL_TABLET | ORAL | 1 refills | Status: AC

## 2024-11-06 MED ORDER — CARBIDOPA-LEVODOPA 25-100 MG PO TABS: ORAL_TABLET | ORAL | 0 refills | Status: AC

## 2024-11-06 MED ORDER — ONGENTYS 50 MG PO CAPS: 1.0000 | ORAL_CAPSULE | Freq: Every day | ORAL | 1 refills | Status: AC

## 2024-11-06 MED ORDER — BUSPIRONE HCL 7.5 MG PO TABS: 7.5000 mg | ORAL_TABLET | Freq: Two times a day (BID) | ORAL | 1 refills | Status: AC

## 2024-11-06 NOTE — Patient Instructions (Addendum)
   VISIT SUMMARY: Today, we discussed your ongoing management of Parkinson's disease, including medication adjustments to help with rigidity and stiffness. We also addressed your nocturia, sleep apnea, history of skin cancer, syncope, and anxiety symptoms.  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 Essential tremor is a nervous system disorder that causes involuntary and rhythmic shaking. We have adjusted your carbidopa -levodopa  regimen and added a long-acting dose at bedtime to help with your symptoms. You should continue taking your other medications as prescribed. We may consider physical therapy programs like PWR moves or LSVT big and loud in the future, and a levodopa  pump if needed.  -NOCTURIA: Nocturia is the need to wake up and urinate multiple times during the night. We were going to refer you to urology for further evaluation but you wanted to find someone in [place] first.  -MILD OBSTRUCTIVE SLEEP APNEA: Obstructive sleep apnea is a condition where your breathing stops and starts during sleep. You have mild sleep apnea and have had difficulty tolerating CPAP therapy. Please continue to follow up with cardiology for management.  -PERSONAL HISTORY OF SCALP MELANOMA, SQUAMOUS CELL CARCINOMA, AND BASAL CELL CARCINOMA: You have a history of skin cancers, including melanoma, squamous cell carcinoma, and basal cell carcinoma. Continue your regular dermatology follow-ups every three months to monitor your skin health.   -ANXIETY SYMPTOMS: Anxiety is a feeling of worry or fear that can be mild or severe. We discussed starting Buspar at 7.5 mg twice daily after two weeks of your current medication adjustment. Counseling may also be helpful for managing your anxiety.  Let us  know if you want a referral  INSTRUCTIONS: Please let me know if you want a urology referral in  [place]. Take carbidopa /levodopa  25/100, 1.5 tablets at 7 AM/2 tablet at 11 AM/2 tablet at 3 PM/1 tablet at 7 PM AND add carbidopa /levodopa  50/200 at bed.  After two weeks of your current medication adjustment, start taking Buspar at 7.5 mg twice daily for anxiety. Continue your regular dermatology follow-ups every three months. Consider physical therapy programs like PWR moves or LSVT big and loud in the future if needed. Continue to follow up with cardiology for sleep apnea management.                      Contains text generated by [employer]bridge.                                 Contains text generated by [employer]bridge.

## 2025-01-05 ENCOUNTER — Telehealth: Payer: Self-pay

## 2025-01-05 NOTE — Telephone Encounter (Signed)
 SABRA

## 2025-01-07 ENCOUNTER — Ambulatory Visit: Admitting: Cardiology

## 2025-05-07 ENCOUNTER — Ambulatory Visit: Admitting: Neurology
